# Patient Record
Sex: Male | Born: 1990
Health system: Southern US, Community
[De-identification: ages and names within clinical notes are randomized; demographics above are authoritative.]

## PROBLEM LIST (undated history)

## (undated) DIAGNOSIS — A64 Unspecified sexually transmitted disease: Secondary | ICD-10-CM

## (undated) DIAGNOSIS — A539 Syphilis, unspecified: Secondary | ICD-10-CM

---

## 2011-05-10 ENCOUNTER — Encounter (HOSPITAL_COMMUNITY): Payer: Self-pay | Admitting: Emergency Medicine

## 2011-05-10 ENCOUNTER — Emergency Department (HOSPITAL_COMMUNITY): Payer: No Typology Code available for payment source

## 2011-05-10 ENCOUNTER — Emergency Department (HOSPITAL_COMMUNITY)
Admission: EM | Admit: 2011-05-10 | Discharge: 2011-05-10 | Disposition: A | Discharge status: Home Health | Payer: No Typology Code available for payment source | Attending: Emergency Medicine | Admitting: Emergency Medicine

## 2011-05-10 DIAGNOSIS — Y998 Other external cause status: Secondary | ICD-10-CM | POA: Insufficient documentation

## 2011-05-10 DIAGNOSIS — S0101XA Laceration without foreign body of scalp, initial encounter: Secondary | ICD-10-CM

## 2011-05-10 DIAGNOSIS — Y92410 Unspecified street and highway as the place of occurrence of the external cause: Secondary | ICD-10-CM | POA: Insufficient documentation

## 2011-05-10 DIAGNOSIS — S0100XA Unspecified open wound of scalp, initial encounter: Secondary | ICD-10-CM | POA: Insufficient documentation

## 2011-05-10 DIAGNOSIS — M25529 Pain in unspecified elbow: Secondary | ICD-10-CM | POA: Insufficient documentation

## 2011-05-10 MED ORDER — HYDROCODONE-ACETAMINOPHEN 5-500 MG PO TABS
1.0000 | ORAL_TABLET | Freq: Four times a day (QID) | ORAL | Status: AC | PRN
Start: 2011-05-10 — End: 2011-05-20

## 2011-05-10 MED ORDER — IBUPROFEN 600 MG PO TABS: 600.0000 mg | ORAL_TABLET | Freq: Four times a day (QID) | ORAL | Status: AC | PRN

## 2011-05-10 MED ORDER — FENTANYL CITRATE 0.05 MG/ML IJ SOLN
12.5000 ug | INTRAMUSCULAR | Status: AC | PRN
  Administered 2011-05-10 (×2): 12.5 ug via INTRAVENOUS
  Filled 2011-05-10: qty 2

## 2011-05-10 NOTE — ED Notes (Signed)
Patient transported to CT 

## 2011-05-10 NOTE — ED Notes (Signed)
Pt becoming agitated, pulling at c-collar, trying to get out of bed and raising his voice at staff. Pt c/o HA and nausea. Pt going to radiology at this time.
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
## 2011-05-10 NOTE — ED Notes (Signed)
MD in room placing staples to laceration on right side of scalp. Pt tolerated well.
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
## 2011-05-10 NOTE — ED Notes (Signed)
Pt back from radiology. C/O HA and discomfort from c-collar

## 2011-05-10 NOTE — Discharge Instructions (Signed)
Laceration Care, Adult A laceration is a cut or lesion that goes through all layers of the skin and into the tissue just beneath the skin. TREATMENT  Some lacerations may not require closure. Some lacerations may not be able to be closed due to an increased risk of infection. It is important to see your caregiver as soon as possible after an injury to minimize the risk of infection and maximize the opportunity for successful closure. If closure is appropriate, pain medicines may be given, if needed. The wound will be cleaned to help prevent infection. Your caregiver will use stitches (sutures), staples, wound glue (adhesive), or skin adhesive strips to repair the laceration. These tools bring the skin edges together to allow for faster healing and a better cosmetic outcome. However, all wounds will heal with a scar. Once the wound has healed, scarring can be minimized by covering the wound with sunscreen during the day for 1 full year. HOME CARE INSTRUCTIONS  For sutures or staples:  Keep the wound clean and dry.   If you were given a bandage (dressing), you should change it at least once a day. Also, change the dressing if it becomes wet or dirty, or as directed by your caregiver.   Wash the wound with soap and water 2 times a day. Rinse the wound off with water to remove all soap. Pat the wound dry with a clean towel.   After cleaning, apply a thin layer of the antibiotic ointment as recommended by your caregiver. This will help prevent infection and keep the dressing from sticking.   You may shower as usual after the first 24 hours. Do not soak the wound in water until the sutures are removed.   Only take over-the-counter or prescription medicines for pain, discomfort, or fever as directed by your caregiver.   Get your sutures or staples removed as directed by your caregiver.  For skin adhesive strips:  Keep the wound clean and dry.   Do not get the skin adhesive strips wet. You may bathe  carefully, using caution to keep the wound dry.   If the wound gets wet, pat it dry with a clean towel.   Skin adhesive strips will fall off on their own. You may trim the strips as the wound heals. Do not remove skin adhesive strips that are still stuck to the wound. They will fall off in time.  For wound adhesive:  You may briefly wet your wound in the shower or bath. Do not soak or scrub the wound. Do not swim. Avoid periods of heavy perspiration until the skin adhesive has fallen off on its own. After showering or bathing, gently pat the wound dry with a clean towel.   Do not apply liquid medicine, cream medicine, or ointment medicine to your wound while the skin adhesive is in place. This may loosen the film before your wound is healed.   If a dressing is placed over the wound, be careful not to apply tape directly over the skin adhesive. This may cause the adhesive to be pulled off before the wound is healed.   Avoid prolonged exposure to sunlight or tanning lamps while the skin adhesive is in place. Exposure to ultraviolet light in the first year will darken the scar.   The skin adhesive will usually remain in place for 5 to 10 days, then naturally fall off the skin. Do not pick at the adhesive film.  You may need a tetanus shot if:  You   cannot remember when you had your last tetanus shot.   You have never had a tetanus shot.  If you get a tetanus shot, your arm may swell, get red, and feel warm to the touch. This is common and not a problem. If you need a tetanus shot and you choose not to have one, there is a rare chance of getting tetanus. Sickness from tetanus can be serious. SEEK MEDICAL CARE IF:   You have redness, swelling, or increasing pain in the wound.   You see a red line that goes away from the wound.   You have yellowish-white fluid (pus) coming from the wound.   You have a fever.   You notice a bad smell coming from the wound or dressing.   Your wound breaks  open before or after sutures have been removed.   You notice something coming out of the wound such as wood or glass.   Your wound is on your hand or foot and you cannot move a finger or toe.  SEEK IMMEDIATE MEDICAL CARE IF:   Your pain is not controlled with prescribed medicine.   You have severe swelling around the wound causing pain and numbness or a change in color in your arm, hand, leg, or foot.   Your wound splits open and starts bleeding.   You have worsening numbness, weakness, or loss of function of any joint around or beyond the wound.   You develop painful lumps near the wound or on the skin anywhere on your body.  MAKE SURE YOU:   Understand these instructions.   Will watch your condition.   Will get help right away if you are not doing well or get worse.  Document Released: 03/07/2005 Document Revised: 11/17/2010 Document Reviewed: 08/31/2010 [facility] Patient Information 2012 [place], [place].Motor Vehicle Collision  It is common to have multiple bruises and sore muscles after a motor vehicle collision (MVC). These tend to feel worse for the first 24 hours. You may have the most stiffness and soreness over the first several hours. You may also feel worse when you wake up the first morning after your collision. After this point, you will usually begin to improve with each day. The speed of improvement often depends on the severity of the collision, the number of injuries, and the location and nature of these injuries. HOME CARE INSTRUCTIONS   Put ice on the injured area.   Put ice in a plastic bag.   Place a towel between your skin and the bag.   Leave the ice on for 15 to 20 minutes, 3 to 4 times a day.   Drink enough fluids to keep your urine clear or pale yellow. Do not drink alcohol.   Take a warm shower or bath once or twice a day. This will increase blood flow to sore muscles.   You may return to activities as directed by your caregiver. Be careful when  lifting, as this may aggravate neck or back pain.   Only take over-the-counter or prescription medicines for pain, discomfort, or fever as directed by your caregiver. Do not use aspirin. This may increase bruising and bleeding.  SEEK IMMEDIATE MEDICAL CARE IF:  You have numbness, tingling, or weakness in the arms or legs.   You develop severe headaches not relieved with medicine.   You have severe neck pain, especially tenderness in the middle of the back of your neck.   You have changes in bowel or bladder control.   There  is increasing pain in any area of the body.   You have shortness of breath, lightheadedness, dizziness, or fainting.   You have chest pain.   You feel sick to your stomach (nauseous), throw up (vomit), or sweat.   You have increasing abdominal discomfort.   There is blood in your urine, stool, or vomit.   You have pain in your shoulder (shoulder strap areas).   You feel your symptoms are getting worse.  MAKE SURE YOU:   Understand these instructions.   Will watch your condition.   Will get help right away if you are not doing well or get worse.  Document Released: 03/07/2005 Document Revised: 11/17/2010 Document Reviewed: 08/04/2010 [facility] Patient Information 2012 [place], [place].
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
## 2011-05-17 ENCOUNTER — Emergency Department (HOSPITAL_COMMUNITY)
Admission: EM | Admit: 2011-05-17 | Discharge: 2011-05-17 | Disposition: A | Discharge status: Other Acute Inpatient Hospital | Payer: No Typology Code available for payment source

## 2011-05-17 ENCOUNTER — Encounter (HOSPITAL_COMMUNITY): Payer: Self-pay | Admitting: *Deleted

## 2011-05-17 ENCOUNTER — Emergency Department (HOSPITAL_COMMUNITY)
Admission: EM | Admit: 2011-05-17 | Discharge: 2011-05-17 | Disposition: A | Discharge status: Home / Self Care | Payer: No Typology Code available for payment source | Attending: Emergency Medicine | Admitting: Emergency Medicine

## 2011-05-17 DIAGNOSIS — S0191XA Laceration without foreign body of unspecified part of head, initial encounter: Secondary | ICD-10-CM

## 2011-05-17 DIAGNOSIS — Z4802 Encounter for removal of sutures: Secondary | ICD-10-CM

## 2011-05-17 DIAGNOSIS — R51 Headache: Secondary | ICD-10-CM | POA: Insufficient documentation

## 2011-05-17 NOTE — ED Provider Notes (Signed)
Medical screening examination/treatment/procedure(s) were performed by non-physician practitioner and as supervising physician I was immediately available for consultation/collaboration.   Laray Anger, DO 05/17/11 2131
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
## 2011-05-17 NOTE — ED Notes (Signed)
Patient is here to have staples removed from the posterior scalp.  7 staples noted.  Patient reports they were placed last week

## 2011-05-17 NOTE — Discharge Instructions (Signed)
Staple Care and Removal  Your caregiver has used staples today to repair your wound. Staples are used to help a wound heal faster by holding the edges of the wound together. The staples can be removed when the wound has healed well enough to stay together after the staples are removed. A dressing (wound covering), depending on the location of the wound, may have been applied. This may be changed once per day or as instructed. If the dressing sticks, it may be soaked off with soapy water or hydrogen peroxide.  Only take over-the-counter or prescription medicines for pain, discomfort, or fever as directed by your caregiver.   If you did not receive a tetanus shot today because you did not recall when your last one was given, check with your caregiver when you have your staples removed to determine if one is needed.  Return to your caregiver's office in 1 week or as suggested to have your staples removed.  SEEK IMMEDIATE MEDICAL CARE IF:   · You have redness, swelling, or increasing pain in the wound.   · You have pus coming from the wound.   · You have a fever.   · You notice a bad smell coming from the wound or dressing.   · Your wound edges break open after staples have been removed.   Document Released: 11/30/2000 Document Revised: 11/17/2010 Document Reviewed: 12/15/2004  [employer]® Patient Information ©2012 [employer].

## 2011-10-04 ENCOUNTER — Emergency Department (HOSPITAL_COMMUNITY)
Admission: EM | Admit: 2011-10-04 | Discharge: 2011-10-05 | Disposition: A | Discharge status: Home / Self Care | Payer: Self-pay | Attending: Emergency Medicine | Admitting: Emergency Medicine

## 2011-10-04 ENCOUNTER — Encounter (HOSPITAL_COMMUNITY): Payer: Self-pay | Admitting: Emergency Medicine

## 2011-10-04 DIAGNOSIS — A5149 Other secondary syphilitic conditions: Secondary | ICD-10-CM | POA: Insufficient documentation

## 2011-10-04 MED ORDER — PENICILLIN G BENZATHINE 1200000 UNIT/2ML IM SUSP
2.4000 10*6.[IU] | Freq: Once | INTRAMUSCULAR | Status: AC
  Administered 2011-10-05: 2.4 10*6.[IU] via INTRAMUSCULAR
  Filled 2011-10-04: qty 4

## 2011-10-04 NOTE — ED Provider Notes (Signed)
History     CSN: [id number]  Arrival date & time 10/04/11  2118   First MD Initiated Contact with Patient 10/04/11 2340      Chief Complaint  Patient presents with  . Back Pain    (Consider location/radiation/quality/duration/timing/severity/associated sxs/prior treatment) HPI This is a 21 year old black male who works out at [employer] today. He was not aware of any injury but subsequently developed right flank pain about 7:30 PM. He states that pain has now resolved. He is now concerned about a hyperpigmented rash of his palms and soles that been present for about a week. He states he was treated for primary syphilis about a year and a half ago with 2 shots of antibiotics in the buttocks. He states he also has a history of herpes but has no active lesions. The lesions are not painful or pruritic.   History reviewed. No pertinent past medical history.  History reviewed. No pertinent past surgical history.  No family history on file.  History  Substance Use Topics  . Smoking status: Former Games developer  . Smokeless tobacco: Not on file  . Alcohol Use: Yes     occasional      Review of Systems  All other systems reviewed and are negative.    Allergies  Review of patient's allergies indicates no known allergies.  Home Medications   Current Outpatient Rx  Name Route Sig Dispense Refill  . NAPHAZOLINE HCL 0.012 % OP SOLN Both Eyes Place 1 drop into both eyes 4 (four) times daily. For red eyes.      BP 121/67  Pulse 99  Temp 99.4 F (37.4 C) (Oral)  Resp 18  SpO2 99%  Physical Exam General: Well-developed, well-nourished male in no acute distress; appearance consistent with age of record HENT: normocephalic, atraumatic Eyes: pupils equal round and reactive to light; extraocular muscles intact Neck: supple Heart: regular rate and rhythm Lungs: clear to auscultation bilaterally Abdomen: soft; nondistended GU: No flank tenderness Extremities: No deformity; full range  of motion Neurologic: Awake, alert and oriented; motor function intact in all extremities and symmetric; no facial droop Skin: Warm and dry; hyperpigmented maculopapular rash of the palms and soles Psychiatric: Normal mood and affect    ED Course  Procedures (including critical care time)     MDM  Rash consistent with secondary syphilis. Will treat for syphilis and refer to infectious disease clinic for followup.        Hanley Seamen, MD 10/04/11 [phone]

## 2011-10-04 NOTE — ED Notes (Signed)
Pt reports having lower right back pain 10/10 earlier this evening, states that pain has subsided to 5/10 att this time. Pt also reports bilateral foot "spots" on the bottom of his feet and hands. Pt also has an area approximately size of a dime that is round with a white ring around it on bottom of left foot.

## 2011-10-04 NOTE — ED Notes (Signed)
Pt c/o right side lower back pain that started today around 730p.  Pt worked out at [employer] today but denies injury.  Pt denies abd pain and is afebrile.

## 2011-10-06 ENCOUNTER — Encounter (HOSPITAL_COMMUNITY): Payer: Self-pay | Admitting: *Deleted

## 2011-10-06 DIAGNOSIS — Z87891 Personal history of nicotine dependence: Secondary | ICD-10-CM | POA: Insufficient documentation

## 2011-10-06 DIAGNOSIS — J189 Pneumonia, unspecified organism: Secondary | ICD-10-CM | POA: Insufficient documentation

## 2011-10-06 LAB — URINALYSIS, ROUTINE W REFLEX MICROSCOPIC
Glucose, UA: NEGATIVE mg/dL
Hgb urine dipstick: NEGATIVE
Specific Gravity, Urine: 1.038 — ABNORMAL HIGH (ref 1.005–1.030)

## 2011-10-06 LAB — URINE MICROSCOPIC-ADD ON

## 2011-10-06 NOTE — ED Notes (Signed)
Patient with c/o back pain.  Patient is having flu like symptoms.  Patient was seen at [facility] for back pain and was told that he had strain muscle and sent home.  Patient feels nauseated as well

## 2011-10-07 ENCOUNTER — Emergency Department (HOSPITAL_COMMUNITY)
Admission: EM | Admit: 2011-10-07 | Discharge: 2011-10-07 | Disposition: A | Discharge status: Home / Self Care | Payer: No Typology Code available for payment source | Attending: Emergency Medicine | Admitting: Emergency Medicine

## 2011-10-07 ENCOUNTER — Emergency Department (HOSPITAL_COMMUNITY): Payer: No Typology Code available for payment source

## 2011-10-07 DIAGNOSIS — J189 Pneumonia, unspecified organism: Secondary | ICD-10-CM

## 2011-10-07 LAB — CBC WITH DIFFERENTIAL/PLATELET
Basophils Absolute: 0 10*3/uL (ref 0.0–0.1)
Basophils Relative: 0 % (ref 0–1)
Eosinophils Absolute: 0 10*3/uL (ref 0.0–0.7)
HCT: 40.9 % (ref 39.0–52.0)
Hemoglobin: 14.1 g/dL (ref 13.0–17.0)
MCH: 30.2 pg (ref 26.0–34.0)
MCHC: 34.5 g/dL (ref 30.0–36.0)
Monocytes Absolute: 1.1 10*3/uL — ABNORMAL HIGH (ref 0.1–1.0)
Monocytes Relative: 8 % (ref 3–12)
Neutro Abs: 11.2 10*3/uL — ABNORMAL HIGH (ref 1.7–7.7)
Neutrophils Relative %: 83 % — ABNORMAL HIGH (ref 43–77)
RDW: 13.4 % (ref 11.5–15.5)

## 2011-10-07 MED ORDER — IBUPROFEN 400 MG PO TABS
800.0000 mg | ORAL_TABLET | Freq: Once | ORAL | Status: AC
  Administered 2011-10-07: 800 mg via ORAL
  Filled 2011-10-07: qty 2

## 2011-10-07 MED ORDER — LIDOCAINE HCL (PF) 1 % IJ SOLN
INTRAMUSCULAR | Status: AC
  Filled 2011-10-07: qty 5

## 2011-10-07 MED ORDER — IBUPROFEN 600 MG PO TABS: 600.0000 mg | ORAL_TABLET | Freq: Four times a day (QID) | ORAL | Status: AC | PRN

## 2011-10-07 MED ORDER — CEFTRIAXONE SODIUM 1 G IJ SOLR
1.0000 g | Freq: Once | INTRAMUSCULAR | Status: AC
  Administered 2011-10-07: 1 g via INTRAMUSCULAR
  Filled 2011-10-07: qty 10

## 2011-10-07 MED ORDER — AZITHROMYCIN 250 MG PO TABS
500.0000 mg | ORAL_TABLET | Freq: Once | ORAL | Status: AC
  Administered 2011-10-07: 500 mg via ORAL
  Filled 2011-10-07: qty 2

## 2011-10-07 MED ORDER — AZITHROMYCIN 250 MG PO TABS: 250.0000 mg | ORAL_TABLET | Freq: Every day | ORAL | Status: AC

## 2011-10-07 NOTE — ED Provider Notes (Signed)
History     CSN: [id number]  Arrival date & time 10/06/11  2147   First MD Initiated Contact with Patient 10/07/11 0208      Chief Complaint  Patient presents with  . Back Pain    (Consider location/radiation/quality/duration/timing/severity/associated sxs/prior treatment) HPI Comments: Patient was seen 2 days ago, with a complaint of back pain, and he was treated for secondary syphilis, to your rash on the soles of his feet and palms of his hands with IM penicillin.  Presents today with recurrent back pain and "flulike symptoms, fevers, chills, also, reports discomfort.  While breathing, and a cough, worse at weekly in the morning.  He, says it feels, like something is catching on his right side is taking over-the-counter Advil or ibuprofen, without much relief.  For his symptoms  Patient is a 21 y.o. male presenting with back pain. The history is provided by the patient.  Back Pain  This is a recurrent problem. The current episode started more than 2 days ago. The problem occurs constantly. The problem has been gradually worsening. Associated symptoms include a fever.    History reviewed. No pertinent past medical history.  History reviewed. No pertinent past surgical history.  History reviewed. No pertinent family history.  History  Substance Use Topics  . Smoking status: Former Games developer  . Smokeless tobacco: Not on file  . Alcohol Use: Yes     occasional      Review of Systems  Constitutional: Positive for fever and chills.  Respiratory: Positive for cough and shortness of breath.   Musculoskeletal: Positive for myalgias and back pain.    Allergies  Review of patient's allergies indicates no known allergies.  Home Medications   Current Outpatient Rx  Name Route Sig Dispense Refill  . NAPHAZOLINE HCL 0.012 % OP SOLN Both Eyes Place 1 drop into both eyes daily as needed. For red eyes.    . AZITHROMYCIN 250 MG PO TABS Oral Take 1 tablet (250 mg total) by mouth  daily. 4 tablet 0  . IBUPROFEN 600 MG PO TABS Oral Take 1 tablet (600 mg total) by mouth every 6 (six) hours as needed for pain. 30 tablet 0    BP 121/82  Pulse 74  Temp 97.6 F (36.4 C) (Oral)  Resp 20  SpO2 99%  Physical Exam  Constitutional: He is oriented to person, place, and time. He appears well-developed and well-nourished.  HENT:  Head: Normocephalic.  Eyes: Pupils are equal, round, and reactive to light.  Neck: Normal range of motion.  Cardiovascular: Normal rate.   Pulmonary/Chest: Effort normal and breath sounds normal. No respiratory distress. He has no wheezes. He exhibits no tenderness.  Abdominal: Soft. He exhibits no distension.  Musculoskeletal: Normal range of motion. He exhibits no edema.  Neurological: He is alert and oriented to person, place, and time.  Skin: Skin is warm and dry.    ED Course  Procedures (including critical care time)  Labs Reviewed  URINALYSIS, ROUTINE W REFLEX MICROSCOPIC - Abnormal; Notable for the following:    Color, Urine AMBER (*)  BIOCHEMICALS MAY BE AFFECTED BY COLOR   Specific Gravity, Urine 1.038 (*)     Bilirubin Urine SMALL (*)     Ketones, ur 15 (*)     Protein, ur 30 (*)     Leukocytes, UA SMALL (*)     All other components within normal limits  CBC WITH DIFFERENTIAL - Abnormal; Notable for the following:    WBC 13.5 (*)  Neutrophils Relative 83 (*)     Neutro Abs 11.2 (*)     Lymphocytes Relative 9 (*)     Monocytes Absolute 1.1 (*)     All other components within normal limits  URINE MICROSCOPIC-ADD ON  RAPID HIV SCREEN ([facility])   Dg Chest 2 View  10/07/2011  *RADIOLOGY REPORT*  Clinical Data: Right shoulder and chest pain for 3 days.  Flu-like symptoms.  CHEST - 2 VIEW  Comparison: 05/10/2011  Findings: Interval development of focal airspace infiltration in the right lower lung laterally consistent with pneumonia.  Normal heart size and pulmonary vascularity.  Left lung remains clear and expanded.  No  blunting of costophrenic angles.  No pneumothorax.  IMPRESSION: Focal airspace opacity in the right lower lung consistent with pneumonia.  Original Report Authenticated By: Marlon Pel, M.D.     1. Community acquired pneumonia       MDM   Will obtain chest x-ray to to patient's new complaint of feeling short of breath.  Low-grade fever, and a cough.  This patient is also of concern and great risk for HIV, do, to his lifestyle and history of secondary syphilis, I requested a CBC, which indicates he has a slight elevation in his white count.  Have also asked for an HIV test, which at this time.  Has not been resulted yet.  Review of his chest x-ray reveals a right sided infiltrate, consistent with a pneumonia        Arman Filter, NP 10/07/11 0223  Arman Filter, NP 10/07/11 1610

## 2011-10-07 NOTE — ED Notes (Signed)
No critical labs noted at this time.

## 2013-01-06 ENCOUNTER — Emergency Department (HOSPITAL_COMMUNITY)
Admission: EM | Admit: 2013-01-06 | Discharge: 2013-01-06 | Disposition: A | Discharge status: Home / Self Care | Payer: BC Managed Care – PPO | Attending: Emergency Medicine | Admitting: Emergency Medicine

## 2013-01-06 DIAGNOSIS — S01501A Unspecified open wound of lip, initial encounter: Secondary | ICD-10-CM | POA: Insufficient documentation

## 2013-01-06 DIAGNOSIS — S139XXA Sprain of joints and ligaments of unspecified parts of neck, initial encounter: Secondary | ICD-10-CM | POA: Insufficient documentation

## 2013-01-06 DIAGNOSIS — Z87891 Personal history of nicotine dependence: Secondary | ICD-10-CM | POA: Insufficient documentation

## 2013-01-06 DIAGNOSIS — S161XXA Strain of muscle, fascia and tendon at neck level, initial encounter: Secondary | ICD-10-CM

## 2013-01-06 DIAGNOSIS — S01511A Laceration without foreign body of lip, initial encounter: Secondary | ICD-10-CM

## 2013-01-06 MED ORDER — IBUPROFEN 800 MG PO TABS
800.0000 mg | ORAL_TABLET | Freq: Once | ORAL | Status: AC
  Administered 2013-01-06: 800 mg via ORAL
  Filled 2013-01-06: qty 1
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
## 2013-01-07 NOTE — ED Provider Notes (Signed)
Medical screening examination/treatment/procedure(s) were performed by non-physician practitioner and as supervising physician I was immediately available for consultation/collaboration.   Loren Racer, MD 01/07/13 [phone]

## 2013-01-29 ENCOUNTER — Emergency Department (HOSPITAL_COMMUNITY)
Admission: EM | Admit: 2013-01-29 | Discharge: 2013-01-29 | Disposition: A | Discharge status: Home / Self Care | Payer: BC Managed Care – PPO | Attending: Emergency Medicine | Admitting: Emergency Medicine

## 2013-01-29 ENCOUNTER — Encounter (HOSPITAL_COMMUNITY): Payer: Self-pay | Admitting: Emergency Medicine

## 2013-01-29 DIAGNOSIS — Z113 Encounter for screening for infections with a predominantly sexual mode of transmission: Secondary | ICD-10-CM | POA: Insufficient documentation

## 2013-01-29 DIAGNOSIS — Z4802 Encounter for removal of sutures: Secondary | ICD-10-CM | POA: Insufficient documentation

## 2013-01-29 DIAGNOSIS — F17200 Nicotine dependence, unspecified, uncomplicated: Secondary | ICD-10-CM | POA: Insufficient documentation

## 2013-01-29 DIAGNOSIS — B002 Herpesviral gingivostomatitis and pharyngotonsillitis: Secondary | ICD-10-CM | POA: Insufficient documentation

## 2013-01-29 MED ORDER — VALACYCLOVIR HCL 1 G PO TABS: 2000.0000 mg | ORAL_TABLET | Freq: Two times a day (BID) | ORAL | Status: DC

## 2013-01-29 NOTE — ED Provider Notes (Signed)
Medical screening examination/treatment/procedure(s) were performed by non-physician practitioner and as supervising physician I was immediately available for consultation/collaboration.    Nelia Shi, MD 01/29/13 [phone]

## 2013-01-29 NOTE — ED Notes (Signed)
Patient is alert and oriented x3.  He was given DC instructions and follow up visit instructions.  Patient gave verbal understanding.  He was DC ambulatory under his own power to home.  V/S stable.  He was not showing any signs of distress on DC 

## 2013-01-29 NOTE — ED Provider Notes (Signed)
CSN: [id number]     Arrival date & time 01/29/13  2219 History  This chart was scribed for non-physician practitioner Ivonne Andrew, PA-C, working with Nelia Shi, MD by Dorothey Baseman, ED Scribe. This patient was seen in room WTR9/WTR9 and the patient's care was started at 10:49 PM.    Chief Complaint  Patient presents with  . Suture / Staple Removal   The history is provided by the patient. No language interpreter was used.   HPI Comments: Chad Austin is a 22 y.o. male who presents to the Emergency Department requesting removal of sutures from his mouth that he received 1.5-2 weeks ago. He reports that a few of the stitches have fallen out on their own.   Patient also reports a lesion that he states "looks like a cold sore" to the left-side of the upper lip. He denies any associated pain. He denies any urinary symptoms. Patient denies any other pertinent medical history.   History reviewed. No pertinent past medical history. History reviewed. No pertinent past surgical history. Family History  Problem Relation Age of Onset  . Hypertension Other   . Diabetes Other    History  Substance Use Topics  . Smoking status: Current Every Day Smoker  . Smokeless tobacco: Not on file  . Alcohol Use: Yes     Comment: occasional    Review of Systems  Genitourinary: Negative for difficulty urinating.  Skin: Positive for wound.    Allergies  Review of patient's allergies indicates no known allergies.  Home Medications   Current Outpatient Rx  Name  Route  Sig  Dispense  Refill  . naphazoline (CLEAR EYES) 0.012 % ophthalmic solution   Both Eyes   Place 1 drop into both eyes daily as needed. For red eyes.          Triage Vitals: BP 124/72  Pulse 81  Temp(Src) 97.9 F (36.6 C) (Oral)  Resp 14  SpO2 97%  Physical Exam  Nursing note and vitals reviewed. Constitutional: He is oriented to person, place, and time. He appears well-developed and well-nourished. No distress.   HENT:  Head: Normocephalic and atraumatic.  Eyes: Conjunctivae are normal.  Neck: Normal range of motion. Neck supple.  Cardiovascular: Normal rate, regular rhythm and normal heart sounds.  Exam reveals no gallop and no friction rub.   No murmur heard. Pulmonary/Chest: Effort normal. No respiratory distress.  Abdominal: Soft. He exhibits no distension. There is no tenderness.  Genitourinary: Testes normal and penis normal. Right testis shows no mass and no tenderness. Left testis shows no mass and no tenderness. Circumcised. No penile erythema. No discharge found.  Musculoskeletal: Normal range of motion.  Lymphadenopathy:       Right: No inguinal adenopathy present.       Left: No inguinal adenopathy present.  Neurological: He is alert and oriented to person, place, and time.  Skin: Skin is warm and dry.  2 cm, vesicular lesion to the left-side of the upper lip.   2 blue Prolene sutures the small laceration left lower lip. Wound clean dry and intact, appears well healed no signs of infection.  Psychiatric: He has a normal mood and affect. His behavior is normal.    ED Course  Procedures   DIAGNOSTIC STUDIES: Oxygen Saturation is 97% on room air, normal by my interpretation.    COORDINATION OF CARE: 10:51 PM- Will remove the sutures. Will order STD screening as per the patient's request. Discussed that the lesion to the lip  may be due to a viral infection. Discussed treatment plan with patient at bedside and patient verbalized agreement.    SUTURE REMOVAL Performed by: Ivonne Andrew, PA-C Authorized by: Nelia Shi, MD Consent: Verbal consent obtained. Consent given by: PATIENT Required items: required blood products, implants, devices, and special equipment available  Time out: Immediately prior to procedure a "time out" was called to verify the correct patient, procedure, equipment, support staff and site/side marked as required. Location: Left, lower lip Wound  Appearance: well-healed, no signs of infection Staples Removed: 2 Patient tolerance: Patient tolerated the procedure well with no immediate complications.      MDM   1. Oral herpes simplex infection   2. Visit for suture removal   3. Screen for STD (sexually transmitted disease)      I personally performed the services described in this documentation, which was scribed in my presence. The recorded information has been reviewed and is accurate.     Angus Seller, PA-C 01/29/13 2330

## 2013-01-29 NOTE — ED Notes (Signed)
Pt states he is here to get some stitches removed from his mouth and while he is here he would like to be screened for STDs

## 2013-01-30 LAB — RPR TITER: RPR Titer: 1:16 {titer} — AB

## 2013-01-30 LAB — GC/CHLAMYDIA PROBE AMP: CT Probe RNA: NEGATIVE

## 2013-01-30 LAB — T.PALLIDUM AB, IGG: T pallidum Antibodies (TP-PA): >8 S/CO — ABNORMAL HIGH (ref <0.90)

## 2013-01-31 NOTE — ED Notes (Signed)
Chart sent to EDP office for review.+ RPR 

## 2013-02-01 ENCOUNTER — Emergency Department (HOSPITAL_COMMUNITY)
Admission: EM | Admit: 2013-02-01 | Discharge: 2013-02-01 | Disposition: A | Discharge status: Home / Self Care | Payer: No Typology Code available for payment source | Attending: Emergency Medicine | Admitting: Emergency Medicine

## 2013-02-01 ENCOUNTER — Emergency Department (HOSPITAL_COMMUNITY): Payer: No Typology Code available for payment source

## 2013-02-01 ENCOUNTER — Encounter (HOSPITAL_COMMUNITY): Payer: Self-pay | Admitting: Emergency Medicine

## 2013-02-01 ENCOUNTER — Telehealth (HOSPITAL_COMMUNITY): Payer: Self-pay

## 2013-02-01 DIAGNOSIS — Y9389 Activity, other specified: Secondary | ICD-10-CM | POA: Insufficient documentation

## 2013-02-01 DIAGNOSIS — Z79899 Other long term (current) drug therapy: Secondary | ICD-10-CM | POA: Insufficient documentation

## 2013-02-01 DIAGNOSIS — S139XXA Sprain of joints and ligaments of unspecified parts of neck, initial encounter: Secondary | ICD-10-CM | POA: Insufficient documentation

## 2013-02-01 DIAGNOSIS — Y92410 Unspecified street and highway as the place of occurrence of the external cause: Secondary | ICD-10-CM | POA: Insufficient documentation

## 2013-02-01 DIAGNOSIS — S134XXA Sprain of ligaments of cervical spine, initial encounter: Secondary | ICD-10-CM

## 2013-02-01 DIAGNOSIS — F17200 Nicotine dependence, unspecified, uncomplicated: Secondary | ICD-10-CM | POA: Insufficient documentation

## 2013-02-01 MED ORDER — METHOCARBAMOL 500 MG PO TABS: 500.0000 mg | ORAL_TABLET | Freq: Two times a day (BID) | ORAL | Status: DC
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
## 2013-02-01 NOTE — Progress Notes (Signed)
[facility] provided pt with a list of primary care resources.  °
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
## 2013-02-01 NOTE — ED Provider Notes (Signed)
Medical screening examination/treatment/procedure(s) were performed by non-physician practitioner and as supervising physician I was immediately available for consultation/collaboration.  EKG Interpretation   None         Kelleen Stolze T Yani Lal, MD 02/01/13 1253 
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
## 2013-02-04 ENCOUNTER — Telehealth (HOSPITAL_COMMUNITY): Payer: Self-pay | Admitting: Emergency Medicine

## 2013-02-04 NOTE — ED Notes (Signed)
Chart returned from EDP office. Per Santiago Glad PA-C, follow-up with Urgent Care or Health Department for IM injection of Pencillin.

## 2013-02-05 NOTE — ED Notes (Signed)
Patient is known to [facility] -no need to treat per Pam. His numbers are coming down .

## 2013-02-28 ENCOUNTER — Encounter (HOSPITAL_COMMUNITY): Payer: Self-pay | Admitting: Emergency Medicine

## 2013-02-28 ENCOUNTER — Emergency Department (HOSPITAL_COMMUNITY)
Admission: EM | Admit: 2013-02-28 | Discharge: 2013-02-28 | Disposition: A | Discharge status: Home / Self Care | Payer: BC Managed Care – PPO | Attending: Emergency Medicine | Admitting: Emergency Medicine

## 2013-02-28 DIAGNOSIS — F17200 Nicotine dependence, unspecified, uncomplicated: Secondary | ICD-10-CM | POA: Insufficient documentation

## 2013-02-28 DIAGNOSIS — A539 Syphilis, unspecified: Secondary | ICD-10-CM | POA: Insufficient documentation

## 2013-02-28 MED ORDER — PENICILLIN G BENZATHINE 1200000 UNIT/2ML IM SUSP
2.4000 10*6.[IU] | Freq: Once | INTRAMUSCULAR | Status: AC
  Administered 2013-02-28: 2.4 10*6.[IU] via INTRAMUSCULAR
  Filled 2013-02-28: qty 4

## 2013-02-28 MED ORDER — PENICILLIN G BENZATHINE 1200000 UNIT/2ML IM SUSP
2.4000 10*6.[IU] | Freq: Once | INTRAMUSCULAR | Status: DC
  Filled 2013-02-28: qty 4
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
## 2013-03-04 NOTE — ED Provider Notes (Signed)
Medical screening examination/treatment/procedure(s) were performed by non-physician practitioner and as supervising physician I was immediately available for consultation/collaboration.  EKG Interpretation   None        [name], MD 03/04/13 2114 

## 2013-04-25 ENCOUNTER — Encounter (HOSPITAL_COMMUNITY): Payer: Self-pay | Admitting: Emergency Medicine

## 2013-04-25 ENCOUNTER — Emergency Department (HOSPITAL_COMMUNITY)
Admission: EM | Admit: 2013-04-25 | Discharge: 2013-04-25 | Disposition: A | Discharge status: Home / Self Care | Payer: BC Managed Care – PPO | Attending: Emergency Medicine | Admitting: Emergency Medicine

## 2013-04-25 DIAGNOSIS — R112 Nausea with vomiting, unspecified: Secondary | ICD-10-CM | POA: Insufficient documentation

## 2013-04-25 DIAGNOSIS — R109 Unspecified abdominal pain: Secondary | ICD-10-CM | POA: Insufficient documentation

## 2013-04-25 DIAGNOSIS — R197 Diarrhea, unspecified: Secondary | ICD-10-CM | POA: Insufficient documentation

## 2013-04-25 DIAGNOSIS — F17200 Nicotine dependence, unspecified, uncomplicated: Secondary | ICD-10-CM | POA: Insufficient documentation

## 2013-04-25 LAB — POCT I-STAT, CHEM 8
BUN: 8 mg/dL (ref 6–23)
CALCIUM ION: 1.13 mmol/L (ref 1.12–1.23)
CREATININE: 0.9 mg/dL (ref 0.50–1.35)
Chloride: 101 mEq/L (ref 96–112)
GLUCOSE: 117 mg/dL — AB (ref 70–99)
HCT: 49 % (ref 39.0–52.0)
HEMOGLOBIN: 16.7 g/dL (ref 13.0–17.0)
Potassium: 4 mEq/L (ref 3.7–5.3)
Sodium: 139 mEq/L (ref 137–147)
TCO2: 26 mmol/L (ref 0–100)

## 2013-04-25 MED ORDER — ONDANSETRON HCL 4 MG/2ML IJ SOLN
4.0000 mg | Freq: Once | INTRAMUSCULAR | Status: AC
  Administered 2013-04-25: 4 mg via INTRAVENOUS
  Filled 2013-04-25: qty 2

## 2013-04-25 MED ORDER — ONDANSETRON HCL 4 MG PO TABS: 4.0000 mg | ORAL_TABLET | Freq: Four times a day (QID) | ORAL | Status: DC

## 2013-04-25 MED ORDER — SODIUM CHLORIDE 0.9 % IV BOLUS (SEPSIS)
1000.0000 mL | Freq: Once | INTRAVENOUS | Status: AC
  Administered 2013-04-25: 1000 mL via INTRAVENOUS

## 2013-04-25 NOTE — ED Provider Notes (Signed)
CSN: [id number]     Arrival date & time 04/25/13  1722 History   First MD Initiated Contact with Patient 04/25/13 1813     Chief Complaint  Patient presents with  . Emesis   (Consider location/radiation/quality/duration/timing/severity/associated sxs/prior Treatment) The history is provided by the patient.   Patient reports crampy abdominal pain and N/V x 20+, watery stool x 1, that occurred 25 minutes after eating [place] bacon and eggs this morning.  Emesis is described as "bile."  No blood in emesis or stool.  States abdominal pain has completely resolved and currently he is without nausea but thinks he may be dehydrated.  Has been attempting to drink gatorade but is not holding anything down.  No hx abdominal surgery. No one else who ate this food is complaining of symptoms.  No known sick contacts.    History reviewed. No pertinent past medical history. History reviewed. No pertinent past surgical history. Family History  Problem Relation Age of Onset  . Hypertension Other   . Diabetes Other    History  Substance Use Topics  . Smoking status: Current Every Day Smoker  . Smokeless tobacco: Not on file  . Alcohol Use: Yes     Comment: occasional    Review of Systems  Constitutional: Negative for fever and chills.  Respiratory: Negative for cough and shortness of breath.   Cardiovascular: Negative for chest pain.  Gastrointestinal: Positive for nausea, vomiting and diarrhea. Negative for abdominal pain and blood in stool.  Genitourinary: Negative for dysuria, urgency and frequency.  All other systems reviewed and are negative.    Allergies  Review of patient's allergies indicates no known allergies.  Home Medications  No current outpatient prescriptions on file. BP 126/75  Pulse 111  Temp(Src) 99.1 F (37.3 C) (Oral)  Resp 18  SpO2 99% Physical Exam  Nursing note and vitals reviewed. Constitutional: He appears well-developed and well-nourished. No distress.  HENT:   Head: Normocephalic and atraumatic.  Neck: Neck supple.  Cardiovascular: Normal rate and regular rhythm.   Pulmonary/Chest: Effort normal and breath sounds normal. No respiratory distress. He has no wheezes. He has no rales.  Abdominal: Soft. He exhibits no distension and no mass. There is no tenderness. There is no rebound and no guarding.  Neurological: He is alert. He exhibits normal muscle tone.  Skin: He is not diaphoretic.    ED Course  Procedures (including critical care time) Labs Review Labs Reviewed  POCT I-STAT, CHEM 8 - Abnormal; Notable for the following:    Glucose, Bld 117 (*)    All other components within normal limits   Imaging Review No results found.  EKG Interpretation   None      Filed Vitals:   04/25/13 1816  BP: 126/75  Pulse: 111  Temp: 99.1 F (37.3 C)  Resp: 18    MDM   1. Nausea vomiting and diarrhea     Pt with N/V following eating.  No abdominal pain.  Abdominal exam is benign.  Tachycardic on arrival.  IVF given.  Chem 8 normal.  Mild clinical dehydration on exam.  Pt feeling much improved after IVF.  PO trial prior to discharge.  D/C home with zofran. Discussed result, findings, treatment, and follow up  with patient.  Pt given return precautions.  Pt verbalizes understanding and agrees with plan.        Castle PointEmily Adaleen Hulgan, PA-C 04/25/13 2301

## 2013-04-25 NOTE — Discharge Instructions (Signed)
Read the information below.  Use the prescribed medication as directed.  Please discuss all new medications with your pharmacist.  You may return to the Emergency Department at any time for worsening condition or any new symptoms that concern you.  If you develop fevers, worsening abdominal pain, uncontrolled vomiting, or are unable to tolerate fluids by mouth, return to the ER for a recheck.

## 2013-04-25 NOTE — ED Notes (Signed)
Initial Contact - pt sleeping soundly on stretcher, arousable to verbal stim.  [facility]  IVF inf a/o.  Pt denies further needs/complaints at this time.  NAD.

## 2013-04-25 NOTE — ED Notes (Signed)
Pt was nauseated and vomiting since this am. No diarrhea.  Some cramping.  No fever.  Pt ate sausage/turkey bacon and orange juice at work and started getting sick within 25 min.  Pt last vomited 45 min ago.

## 2013-04-26 NOTE — ED Provider Notes (Signed)
Medical screening examination/treatment/procedure(s) were performed by non-physician practitioner and as supervising physician I was immediately available for consultation/collaboration.  EKG Interpretation   None         Laray AngerKathleen M Mikie Misner, DO 04/26/13 1427
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
## 2015-07-13 ENCOUNTER — Emergency Department (HOSPITAL_COMMUNITY)
Admission: EM | Admit: 2015-07-13 | Discharge: 2015-07-13 | Disposition: A | Discharge status: Home / Self Care | Payer: No Typology Code available for payment source | Attending: Emergency Medicine | Admitting: Emergency Medicine

## 2015-07-13 ENCOUNTER — Encounter (HOSPITAL_COMMUNITY): Payer: Self-pay | Admitting: Emergency Medicine

## 2015-07-13 DIAGNOSIS — F17200 Nicotine dependence, unspecified, uncomplicated: Secondary | ICD-10-CM | POA: Insufficient documentation

## 2015-07-13 DIAGNOSIS — Z79899 Other long term (current) drug therapy: Secondary | ICD-10-CM | POA: Insufficient documentation

## 2015-07-13 DIAGNOSIS — N342 Other urethritis: Secondary | ICD-10-CM

## 2015-07-13 DIAGNOSIS — N341 Nonspecific urethritis: Secondary | ICD-10-CM | POA: Insufficient documentation

## 2015-07-13 LAB — URINALYSIS, ROUTINE W REFLEX MICROSCOPIC
BILIRUBIN URINE: NEGATIVE
GLUCOSE, UA: NEGATIVE mg/dL
HGB URINE DIPSTICK: NEGATIVE
KETONES UR: NEGATIVE mg/dL
LEUKOCYTES UA: NEGATIVE
Nitrite: NEGATIVE
PROTEIN: NEGATIVE mg/dL
Specific Gravity, Urine: 1.009 (ref 1.005–1.030)
pH: 6 (ref 5.0–8.0)

## 2015-07-13 MED ORDER — AZITHROMYCIN 250 MG PO TABS
1000.0000 mg | ORAL_TABLET | Freq: Once | ORAL | Status: AC
  Administered 2015-07-13: 1000 mg via ORAL
  Filled 2015-07-13: qty 4

## 2015-07-13 MED ORDER — CEFTRIAXONE SODIUM 250 MG IJ SOLR
250.0000 mg | Freq: Once | INTRAMUSCULAR | Status: AC
  Administered 2015-07-13: 250 mg via INTRAMUSCULAR
  Filled 2015-07-13: qty 250

## 2015-07-13 NOTE — ED Notes (Signed)
Pt states he began having symptoms of Chlamydia on Wednesday last week. Tried to get in at the health department and wasn't able to.

## 2015-07-13 NOTE — ED Provider Notes (Signed)
CSN: [id number]     Arrival date & time 07/13/15  [id number] History   First MD Initiated Contact with Patient 07/13/15 [phone]     Chief Complaint  Patient presents with  . SEXUALLY TRANSMITTED DISEASE     (Consider location/radiation/quality/duration/timing/severity/associated sxs/prior Treatment) HPI  25 year old male presents with a concern for an STD. He thinks he has gonorrhea. Patient states he has multiple sexual partners. He has had gonorrhea in the past. Over the last 4 days he's been having discharge from his penis. Some burning at the tip of his penis during urination. No abdominal pain. No penile lesions or rashes. Denies testicular pain. Patient was trying to get into the health department but was unable to. He has a history of a positive RPR in the past but states that it has been stable. He denies any rashes to his extremities. No headaches.  History reviewed. No pertinent past medical history. History reviewed. No pertinent past surgical history. Family History  Problem Relation Age of Onset  . Hypertension Other   . Diabetes Other    Social History  Substance Use Topics  . Smoking status: Current Every Day Smoker  . Smokeless tobacco: None  . Alcohol Use: Yes     Comment: occasional    Review of Systems  Constitutional: Negative for fever.  Genitourinary: Positive for dysuria, discharge and penile pain. Negative for penile swelling, scrotal swelling and testicular pain.  Skin: Negative for rash.  All other systems reviewed and are negative.     Allergies  Review of patient's allergies indicates no known allergies.  Home Medications   Prior to Admission medications   Medication Sig Start Date End Date Taking? Authorizing Provider  ondansetron (ZOFRAN) 4 MG tablet Take 1 tablet (4 mg total) by mouth every 6 (six) hours. 04/25/13   Trixie DredgeEmily West, PA-C   BP 157/114 mmHg  Pulse 83  Temp(Src) 98.6 F (37 C) (Oral)  Resp 18  SpO2 100% Physical Exam  Constitutional:  He is oriented to person, place, and time. He appears well-developed and well-nourished.  HENT:  Head: Normocephalic and atraumatic.  Right Ear: External ear normal.  Left Ear: External ear normal.  Nose: Nose normal.  Eyes: Right eye exhibits no discharge. Left eye exhibits no discharge.  Pulmonary/Chest: Effort normal.  Abdominal: Soft. He exhibits no distension. There is no tenderness.  Genitourinary: Penis normal. Right testis shows no swelling and no tenderness. Left testis shows no swelling and no tenderness. Circumcised. No penile erythema or penile tenderness. No discharge found.  No discharge seen. No tenderness or swelling  Musculoskeletal: He exhibits no edema.  Neurological: He is alert and oriented to person, place, and time.  Skin: Skin is warm and dry.  Nursing note and vitals reviewed.   ED Course  Procedures (including critical care time) Labs Review Labs Reviewed  URINALYSIS, ROUTINE W REFLEX MICROSCOPIC (NOT AT [facility])  HIV ANTIBODY (ROUTINE TESTING)  GC/CHLAMYDIA PROBE AMP ([place]) NOT AT [facility]    Imaging Review No results found. I have personally reviewed and evaluated these images and lab results as part of my medical decision-making.   EKG Interpretation None      MDM   Final diagnoses:  Urethritis    Patient symptoms are most consistent with urethritis. He was given Rocephin and azithromycin. He has no clinical signs of syphilis or other acute illness. Urinalysis is unremarkable, UTI seems unlikely. He requests an HIV test which will be drawn. Discharge home with return precautions.  Pricilla Loveless, MD 07/13/15 [phone]

## 2015-07-14 LAB — GC/CHLAMYDIA PROBE AMP (~~LOC~~) NOT AT ARMC
Chlamydia: NEGATIVE
NEISSERIA GONORRHEA: NEGATIVE

## 2015-07-14 LAB — HIV ANTIBODY (ROUTINE TESTING W REFLEX): HIV Screen 4th Generation wRfx: NONREACTIVE

## 2015-11-03 ENCOUNTER — Emergency Department (HOSPITAL_BASED_OUTPATIENT_CLINIC_OR_DEPARTMENT_OTHER)
Admission: EM | Admit: 2015-11-03 | Discharge: 2015-11-03 | Disposition: A | Discharge status: Home / Self Care | Payer: BLUE CROSS/BLUE SHIELD | Attending: Emergency Medicine | Admitting: Emergency Medicine

## 2015-11-03 ENCOUNTER — Encounter (HOSPITAL_BASED_OUTPATIENT_CLINIC_OR_DEPARTMENT_OTHER): Payer: Self-pay | Admitting: *Deleted

## 2015-11-03 DIAGNOSIS — I10 Essential (primary) hypertension: Secondary | ICD-10-CM | POA: Insufficient documentation

## 2015-11-03 DIAGNOSIS — N342 Other urethritis: Secondary | ICD-10-CM

## 2015-11-03 DIAGNOSIS — N341 Nonspecific urethritis: Secondary | ICD-10-CM | POA: Insufficient documentation

## 2015-11-03 DIAGNOSIS — R369 Urethral discharge, unspecified: Secondary | ICD-10-CM | POA: Diagnosis present

## 2015-11-03 DIAGNOSIS — F17200 Nicotine dependence, unspecified, uncomplicated: Secondary | ICD-10-CM | POA: Diagnosis not present

## 2015-11-03 LAB — URINALYSIS, ROUTINE W REFLEX MICROSCOPIC
Bilirubin Urine: NEGATIVE
Glucose, UA: NEGATIVE mg/dL
Hgb urine dipstick: NEGATIVE
Ketones, ur: NEGATIVE mg/dL
Nitrite: NEGATIVE
PH: 6 (ref 5.0–8.0)
Protein, ur: NEGATIVE mg/dL
Specific Gravity, Urine: 1.013 (ref 1.005–1.030)

## 2015-11-03 LAB — URINE MICROSCOPIC-ADD ON: RBC / HPF: NONE SEEN RBC/hpf (ref 0–5)

## 2015-11-03 MED ORDER — ONDANSETRON 8 MG PO TBDP
8.0000 mg | ORAL_TABLET | Freq: Once | ORAL | Status: AC
  Administered 2015-11-03: 8 mg via ORAL
  Filled 2015-11-03: qty 1

## 2015-11-03 MED ORDER — LIDOCAINE HCL (PF) 1 % IJ SOLN
INTRAMUSCULAR | Status: AC
Start: 2015-11-03 — End: 2015-11-03
  Administered 2015-11-03: 1 mL
  Filled 2015-11-03: qty 5

## 2015-11-03 MED ORDER — CEFTRIAXONE SODIUM 250 MG IJ SOLR
250.0000 mg | Freq: Once | INTRAMUSCULAR | Status: AC
  Administered 2015-11-03: 250 mg via INTRAMUSCULAR
  Filled 2015-11-03: qty 250

## 2015-11-03 MED ORDER — AZITHROMYCIN 250 MG PO TABS
1000.0000 mg | ORAL_TABLET | Freq: Once | ORAL | Status: AC
  Administered 2015-11-03: 1000 mg via ORAL
  Filled 2015-11-03: qty 4

## 2015-11-03 NOTE — ED Provider Notes (Signed)
[facility] Provider Note   CSN: [id number] Arrival date & time: 11/03/15  [id number]     History   Chief Complaint Chief Complaint  Patient presents with  . Penile Discharge    HPI Chad Austin is a 25 y.o. male male who presents with chief complaint of penile discharge and dysuria. He is a MSM with a previous hx of syphilis infection who [facility] for tighter draws. The patient noticed some discharge from his penis 4 days ago. He states he is in a monogamous relationship at this time. He denies any new lesions, testicular pain.  HPI  No past medical history on file.  There are no active problems to display for this patient.   No past surgical history on file.     Home Medications    Prior to Admission medications   Medication Sig Start Date End Date Taking? Authorizing Provider  cholecalciferol (VITAMIN D) 1000 units tablet Take 2,000 Units by mouth daily.    Historical Provider, MD  MELATONIN PO Take 50 mg by mouth at bedtime as needed (for sleep).    Historical Provider, MD    Family History Family History  Problem Relation Age of Onset  . Hypertension Other   . Diabetes Other     Social History Social History  Substance Use Topics  . Smoking status: Current Every Day Smoker  . Smokeless tobacco: Not on file  . Alcohol use Yes     Comment: occasional     Allergies   Review of patient's allergies indicates no known allergies.   Review of Systems Review of Systems  Constitutional: Negative for fever.  Gastrointestinal: Negative for abdominal pain and vomiting.  Genitourinary: Positive for discharge and dysuria. Negative for genital sores and testicular pain.     Physical Exam Updated Vital Signs BP (!) 155/109 (BP Location: Left Arm)   Pulse 77   Temp 98.1 F (36.7 C) (Oral)   Resp 18   Ht 5\' 7"  (1.702 m)   Wt 113.4 kg   SpO2 97%   BMI 39.16 kg/m   Physical Exam  Constitutional: He appears  well-developed and well-nourished. No distress.  HENT:  Head: Normocephalic and atraumatic.  Eyes: Conjunctivae are normal. No scleral icterus.  Neck: Normal range of motion. Neck supple.  Cardiovascular: Normal rate, regular rhythm and normal heart sounds.   Pulmonary/Chest: Effort normal and breath sounds normal. No respiratory distress.  Abdominal: Soft. There is no tenderness.  Genitourinary: Penis normal. No penile tenderness.  Genitourinary Comments: Normal male anatomy, circumcised, no testicular tenderness or inguinal lymphadenopathy, no visible discharge or lesions.  Musculoskeletal: He exhibits no edema.  Neurological: He is alert.  Skin: Skin is warm and dry. He is not diaphoretic.  Psychiatric: His behavior is normal.  Nursing note and vitals reviewed.    ED Treatments / Results  Labs (all labs ordered are listed, but only abnormal results are displayed) Labs Reviewed  RPR  HIV ANTIBODY (ROUTINE TESTING)  URINALYSIS, ROUTINE W REFLEX MICROSCOPIC (NOT AT [facility])  GC/CHLAMYDIA PROBE AMP ([place]) NOT AT [facility]    EKG  EKG Interpretation None       Radiology No results found.  Procedures Procedures (including critical care time)  Medications Ordered in ED Medications  cefTRIAXone (ROCEPHIN) injection 250 mg (not administered)  azithromycin (ZITHROMAX) tablet 1,000 mg (not administered)  ondansetron (ZOFRAN-ODT) disintegrating tablet 8 mg (not administered)     Initial Impression / Assessment and Plan /  ED Course  I have reviewed the triage vital signs and the nursing notes.  Pertinent labs & imaging results that were available during my care of the patient were reviewed by me and considered in my medical decision making (see chart for details).  Clinical Course    Patient with urethral radius, he is 25 year old homosexual male and is in a high-risk demographic for HIV transmission. He is also a previous history of syphilis. I discussed prep therapy  with the patient who is very interested and will have referred him to Dr. Judyann Munsonynthia Snider over at the infectious disease clinic.  Patient treated here with Rocephin and azithromycin.  I have drawn, HIV and RPR here. Distress. Safe sexual practices with the patient. He appears safe for discharge at this time  Final Clinical Impressions(s) / ED Diagnoses   Final diagnoses:  Urethritis  Essential hypertension    New Prescriptions New Prescriptions   No medications on file     Arthor Captainbigail Ryman Rathgeber, PA-C 11/03/15 1124    Melene Planan Floyd, DO 11/03/15 1531

## 2015-11-03 NOTE — ED Triage Notes (Signed)
Patient states he has had milky penile discharge for the three days.  States he has had unprotected sex with multiple partners.

## 2015-11-04 LAB — GC/CHLAMYDIA PROBE AMP (~~LOC~~) NOT AT ARMC
Chlamydia: NEGATIVE
NEISSERIA GONORRHEA: NEGATIVE

## 2015-11-04 LAB — HIV ANTIBODY (ROUTINE TESTING W REFLEX): HIV Screen 4th Generation wRfx: NONREACTIVE

## 2015-11-04 LAB — RPR: RPR Ser Ql: REACTIVE — AB

## 2015-11-04 LAB — RPR, QUANT+TP ABS (REFLEX)
Rapid Plasma Reagin, Quant: 1:8 {titer} — ABNORMAL HIGH
TREPONEMA PALLIDUM AB: POSITIVE — AB

## 2015-11-06 ENCOUNTER — Telehealth (HOSPITAL_BASED_OUTPATIENT_CLINIC_OR_DEPARTMENT_OTHER): Payer: Self-pay

## 2016-01-12 ENCOUNTER — Telehealth (HOSPITAL_BASED_OUTPATIENT_CLINIC_OR_DEPARTMENT_OTHER): Payer: Self-pay | Admitting: Emergency Medicine

## 2016-01-12 NOTE — Telephone Encounter (Signed)
Lost to followup 

## 2016-01-25 ENCOUNTER — Encounter (HOSPITAL_BASED_OUTPATIENT_CLINIC_OR_DEPARTMENT_OTHER): Payer: Self-pay | Admitting: *Deleted

## 2016-01-25 ENCOUNTER — Emergency Department (HOSPITAL_BASED_OUTPATIENT_CLINIC_OR_DEPARTMENT_OTHER)
Admission: EM | Admit: 2016-01-25 | Discharge: 2016-01-26 | Disposition: A | Discharge status: Home / Self Care | Payer: BLUE CROSS/BLUE SHIELD | Attending: Emergency Medicine | Admitting: Emergency Medicine

## 2016-01-25 DIAGNOSIS — R369 Urethral discharge, unspecified: Secondary | ICD-10-CM

## 2016-01-25 DIAGNOSIS — F17210 Nicotine dependence, cigarettes, uncomplicated: Secondary | ICD-10-CM | POA: Insufficient documentation

## 2016-01-25 DIAGNOSIS — A539 Syphilis, unspecified: Secondary | ICD-10-CM | POA: Insufficient documentation

## 2016-01-25 DIAGNOSIS — N342 Other urethritis: Secondary | ICD-10-CM | POA: Insufficient documentation

## 2016-01-25 HISTORY — DX: Syphilis, unspecified: A53.9

## 2016-01-25 HISTORY — DX: Unspecified sexually transmitted disease: A64

## 2016-01-25 LAB — URINALYSIS, ROUTINE W REFLEX MICROSCOPIC
Bilirubin Urine: NEGATIVE
Glucose, UA: NEGATIVE mg/dL
HGB URINE DIPSTICK: NEGATIVE
KETONES UR: NEGATIVE mg/dL
NITRITE: NEGATIVE
PH: 6.5 (ref 5.0–8.0)
Protein, ur: NEGATIVE mg/dL
Specific Gravity, Urine: 1.021 (ref 1.005–1.030)

## 2016-01-25 LAB — URINE MICROSCOPIC-ADD ON

## 2016-01-25 MED ORDER — PENICILLIN G BENZATHINE 1200000 UNIT/2ML IM SUSP
2.4000 10*6.[IU] | Freq: Once | INTRAMUSCULAR | Status: AC
  Administered 2016-01-25: 2.4 10*6.[IU] via INTRAMUSCULAR
  Filled 2016-01-25: qty 4

## 2016-01-25 MED ORDER — LIDOCAINE HCL (PF) 1 % IJ SOLN
INTRAMUSCULAR | Status: AC
  Administered 2016-01-25: 0.9 mL
  Filled 2016-01-25: qty 5

## 2016-01-25 MED ORDER — SULFAMETHOXAZOLE-TRIMETHOPRIM 800-160 MG PO TABS: 1.0000 | ORAL_TABLET | Freq: Two times a day (BID) | ORAL | 0 refills | Status: AC

## 2016-01-25 MED ORDER — AZITHROMYCIN 250 MG PO TABS
1000.0000 mg | ORAL_TABLET | Freq: Once | ORAL | Status: AC
  Administered 2016-01-25: 1000 mg via ORAL
  Filled 2016-01-25: qty 4

## 2016-01-25 MED ORDER — CEFTRIAXONE SODIUM 250 MG IJ SOLR
250.0000 mg | Freq: Once | INTRAMUSCULAR | Status: AC
  Administered 2016-01-25: 250 mg via INTRAMUSCULAR
  Filled 2016-01-25: qty 250

## 2016-01-25 NOTE — ED Triage Notes (Signed)
Pt. Reports unprotected sex with 2 partners.

## 2016-01-25 NOTE — Discharge Instructions (Signed)
Followup with the ID clinic.  Follow safe sex practices. Return to the ED if you develop new or worsening symptoms.

## 2016-01-25 NOTE — ED Provider Notes (Signed)
[facility] Note   CSN: [id number] Arrival date & time: 01/25/16  2307  By signing my name below, I, Teofilo PodMatthew P. Jamison, attest that this documentation has been prepared under the direction and in the presence of Glynn OctaveStephen Washington Whedbee, MD . Electronically Signed: Teofilo PodMatthew P. Jamison, ED Scribe. 01/25/2016. 11:21 PM.    History   Chief Complaint Chief Complaint  Patient presents with  . Penile Discharge     The history is provided by the patient. No language interpreter was used.   HPI Comments:  Chad Austin is a 25 y.o. male with PMHx of syphillis who presents to the Emergency Department complaining of sudden onset penile discharge x 1 day. Pt states that the discharge is white in color. Pt reports associated dysuria. Pt is sexually active with 2 male partners currently, and notes that he uses does not use a condom every time. No known allergies to medications. No alleviating factors noted. Pt denies abdominal pain, blood in urine, penile pain, testicular pain, rash, back pain or fever.    No past medical history on file.  There are no active problems to display for this patient.   No past surgical history on file.     Home Medications    Prior to Admission medications   Medication Sig Start Date End Date Taking? Authorizing Provider  cholecalciferol (VITAMIN D) 1000 units tablet Take 2,000 Units by mouth daily.    Historical Provider, MD  MELATONIN PO Take 50 mg by mouth at bedtime as needed (for sleep).    Historical Provider, MD    Family History Family History  Problem Relation Age of Onset  . Hypertension Other   . Diabetes Other     Social History Social History  Substance Use Topics  . Smoking status: Current Every Day Smoker    Packs/day: 0.50    Types: Cigarettes  . Smokeless tobacco: Current User  . Alcohol use Yes     Comment: occasional     Allergies   Patient has no known allergies.   Review of Systems Review of Systems    Genitourinary: Positive for discharge and dysuria. Negative for hematuria, penile pain and testicular pain.  Skin: Negative for rash.   A complete 10 system review of systems was obtained and all systems are negative except as noted in the HPI and PMH.     Physical Exam Updated Vital Signs BP 144/89 (BP Location: Right Arm)   Pulse 94   Temp 98.4 F (36.9 C) (Oral)   Resp 18   Ht 6' (1.829 m)   Wt 230 lb (104.3 kg)   SpO2 100%   BMI 31.19 kg/m   Physical Exam  Constitutional: He is oriented to person, place, and time. He appears well-developed and well-nourished. No distress.  HENT:  Head: Normocephalic and atraumatic.  Mouth/Throat: Oropharynx is clear and moist. No oropharyngeal exudate.  Eyes: Conjunctivae and EOM are normal. Pupils are equal, round, and reactive to light.  Neck: Normal range of motion. Neck supple.  No meningismus.  Cardiovascular: Normal rate, regular rhythm, normal heart sounds and intact distal pulses.   No murmur heard. Pulmonary/Chest: Effort normal and breath sounds normal. No respiratory distress.  Abdominal: Soft. There is no tenderness. There is no rebound and no guarding.  Genitourinary: Penis normal. No penile tenderness.  Genitourinary Comments: No rashes or testicular tenderness.   Musculoskeletal: Normal range of motion. He exhibits no edema or tenderness.  Neurological: He is alert and oriented to  person, place, and time. No cranial nerve deficit. He exhibits normal muscle tone. Coordination normal.  No ataxia on finger to nose bilaterally. No pronator drift. 5/5 strength throughout. CN 2-12 intact.Equal grip strength. Sensation intact.   Skin: Skin is warm.  Psychiatric: He has a normal mood and affect. His behavior is normal.  Nursing note and vitals reviewed.    ED Treatments / Results  DIAGNOSTIC STUDIES:  Oxygen Saturation is 100% on RA, normal by my interpretation.    COORDINATION OF CARE:  11:21 PM Discussed treatment plan  with pt at bedside and pt agreed to plan.   Labs (all labs ordered are listed, but only abnormal results are displayed) Labs Reviewed  URINALYSIS, ROUTINE W REFLEX MICROSCOPIC (NOT AT [facility]) - Abnormal; Notable for the following:       Result Value   APPearance CLOUDY (*)    Leukocytes, UA MODERATE (*)    All other components within normal limits  URINE MICROSCOPIC-ADD ON - Abnormal; Notable for the following:    Squamous Epithelial / LPF 0-5 (*)    Bacteria, UA MANY (*)    All other components within normal limits  URINE CULTURE  RPR  HIV ANTIBODY (ROUTINE TESTING)  RAPID HIV SCREEN (HIV 1/2 AB+AG)  GC/CHLAMYDIA PROBE AMP ([place]) NOT AT [facility]    EKG  EKG Interpretation None       Radiology No results found.  Procedures Procedures (including critical care time)  Medications Ordered in ED Medications - No data to display   Initial Impression / Assessment and Plan / ED Course  I have reviewed the triage vital signs and the nursing notes.  Pertinent labs & imaging results that were available during my care of the patient were reviewed by me and considered in my medical decision making (see chart for details).  Clinical Course    Patient with history of urethritis as well as syphilis presenting with white penile discharge for the past several days. Admits to unprotected sex with multiple partners.  States he was last treated for syphilis last year. Titers from August were positive. Denies any rashes or genital lesions. None seen on exam.  Treatment empirically for gonorrhea, chlamydia, and syphilis. Needs infectious disease follow-up. Safe sex practices discussed. RPR and HIV drawn. Urine culture sent.  Final Clinical Impressions(s) / ED Diagnoses   Final diagnoses:  Penile discharge  Syphilis  Urethritis    New Prescriptions New Prescriptions   No medications on file  I personally performed the services described in this documentation, which was scribed  in my presence. The recorded information has been reviewed and is accurate.     Glynn OctaveStephen Syndey Jaskolski, MD 01/26/16 0002

## 2016-01-27 LAB — URINE CULTURE: Culture: NO GROWTH

## 2016-01-27 LAB — HIV ANTIBODY (ROUTINE TESTING W REFLEX): HIV Screen 4th Generation wRfx: NONREACTIVE

## 2016-01-28 LAB — RPR: RPR: REACTIVE — AB

## 2016-01-28 LAB — RPR, QUANT+TP ABS (REFLEX): TREPONEMA PALLIDUM AB: POSITIVE — AB

## 2016-04-24 ENCOUNTER — Encounter (HOSPITAL_BASED_OUTPATIENT_CLINIC_OR_DEPARTMENT_OTHER): Payer: Self-pay | Admitting: Emergency Medicine

## 2016-04-24 ENCOUNTER — Emergency Department (HOSPITAL_BASED_OUTPATIENT_CLINIC_OR_DEPARTMENT_OTHER)
Admission: EM | Admit: 2016-04-24 | Discharge: 2016-04-24 | Disposition: A | Discharge status: Home / Self Care | Payer: BLUE CROSS/BLUE SHIELD | Attending: Emergency Medicine | Admitting: Emergency Medicine

## 2016-04-24 DIAGNOSIS — Z202 Contact with and (suspected) exposure to infections with a predominantly sexual mode of transmission: Secondary | ICD-10-CM | POA: Insufficient documentation

## 2016-04-24 DIAGNOSIS — F17210 Nicotine dependence, cigarettes, uncomplicated: Secondary | ICD-10-CM | POA: Insufficient documentation

## 2016-04-24 LAB — URINALYSIS, ROUTINE W REFLEX MICROSCOPIC
Bilirubin Urine: NEGATIVE
GLUCOSE, UA: NEGATIVE mg/dL
HGB URINE DIPSTICK: NEGATIVE
KETONES UR: NEGATIVE mg/dL
Leukocytes, UA: NEGATIVE
Nitrite: NEGATIVE
PH: 6.5 (ref 5.0–8.0)
PROTEIN: NEGATIVE mg/dL
Specific Gravity, Urine: 1.017 (ref 1.005–1.030)

## 2016-04-24 MED ORDER — PENICILLIN G BENZATHINE 1200000 UNIT/2ML IM SUSP
INTRAMUSCULAR | Status: AC
  Filled 2016-04-24: qty 2

## 2016-04-24 MED ORDER — PENICILLIN G BENZATHINE 1200000 UNIT/2ML IM SUSP
2.4000 10*6.[IU] | Freq: Once | INTRAMUSCULAR | Status: AC
  Administered 2016-04-24: 2.4 10*6.[IU] via INTRAMUSCULAR
  Filled 2016-04-24: qty 4

## 2016-04-24 NOTE — ED Provider Notes (Signed)
[facility] Note   CSN: [id number] Arrival date & time: 04/24/16  1122     History   Chief Complaint Chief Complaint  Patient presents with  . Dysuria    HPI Chad Austin is a 26 y.o. male.  Patient is a 26 year old male who presents for evaluation of STD exposure. He rates that a representative from the health department called him on Thursday informing him that he had been exposed to syphilis and they recommended he seek medical attention. The patient does report he has had some urethral discharge, but denies any sores or lesions. He reports some dysuria. He reports having had syphilis in the past and has also had other STDs as well.   The history is provided by the patient.  Dysuria   This is a new problem. The current episode started 2 days ago. The problem has not changed since onset.The quality of the pain is described as burning. There has been no fever. He is sexually active. Pertinent negatives include no chills. He has tried nothing for the symptoms.    Past Medical History:  Diagnosis Date  . STD (sexually transmitted disease)   . Syphilis     There are no active problems to display for this patient.   History reviewed. No pertinent surgical history.     Home Medications    Prior to Admission medications   Medication Sig Start Date End Date Taking? Authorizing Provider  cholecalciferol (VITAMIN D) 1000 units tablet Take 2,000 Units by mouth daily.    Historical Provider, MD  MELATONIN PO Take 50 mg by mouth at bedtime as needed (for sleep).    Historical Provider, MD    Family History Family History  Problem Relation Age of Onset  . Hypertension Other   . Diabetes Other     Social History Social History  Substance Use Topics  . Smoking status: Current Every Day Smoker    Packs/day: 0.50    Types: Cigarettes  . Smokeless tobacco: Current User  . Alcohol use Yes     Comment: occasional     Allergies   Patient has no  known allergies.   Review of Systems Review of Systems  Constitutional: Negative for chills.  Genitourinary: Positive for dysuria.  All other systems reviewed and are negative.    Physical Exam Updated Vital Signs BP 154/95 (BP Location: Right Arm)   Pulse 98   Temp 97.9 F (36.6 C) (Oral)   Resp 16   Ht 5\' 7"  (1.702 m)   Wt 245 lb (111.1 kg)   SpO2 100%   BMI 38.37 kg/m   Physical Exam  Constitutional: He is oriented to person, place, and time. He appears well-developed and well-nourished. No distress.  HENT:  Head: Normocephalic and atraumatic.  Mouth/Throat: Oropharynx is clear and moist.  Neck: Normal range of motion. Neck supple.  Cardiovascular: Normal rate and regular rhythm.  Exam reveals no friction rub.   No murmur heard. Pulmonary/Chest: Effort normal and breath sounds normal. No respiratory distress. He has no wheezes. He has no rales.  Abdominal: Soft. Bowel sounds are normal. He exhibits no distension. There is no tenderness.  Genitourinary: Penis normal. No penile tenderness.  Genitourinary Comments: There are no obvious penile or testicular lesions. I see no discharge from the urethra.  Musculoskeletal: Normal range of motion. He exhibits no edema.  Neurological: He is alert and oriented to person, place, and time. Coordination normal.  Skin: Skin is warm and dry. He  is not diaphoretic.  Nursing note and vitals reviewed.    ED Treatments / Results  Labs (all labs ordered are listed, but only abnormal results are displayed) Labs Reviewed  URINALYSIS, ROUTINE W REFLEX MICROSCOPIC  RPR  HIV ANTIBODY (ROUTINE TESTING)  GC/CHLAMYDIA PROBE AMP () NOT AT [facility]    EKG  EKG Interpretation None       Radiology No results found.  Procedures Procedures (including critical care time)  Medications Ordered in ED Medications  penicillin g benzathine (BICILLIN LA) 1200000 UNIT/2ML injection 2.4 Million Units (not administered)      Initial Impression / Assessment and Plan / ED Course  I have reviewed the triage vital signs and the nursing notes.  Pertinent labs & imaging results that were available during my care of the patient were reviewed by me and considered in my medical decision making (see chart for details).     He will be treated for syphilis due to his reported recent exposure. HIV, RPR, GC, and chlamydia tests are all pending.  Final Clinical Impressions(s) / ED Diagnoses   Final diagnoses:  None    New Prescriptions New Prescriptions   No medications on file     Geoffery Lyons, MD 04/24/16 1217

## 2016-04-24 NOTE — Discharge Instructions (Signed)
No unprotected sexual activity for the next 2 weeks.  We will call you if your cultures indicate you require further treatment or action.

## 2016-04-24 NOTE — ED Triage Notes (Signed)
Had unprotected sex x 1 week ago and received a call from [facility] that was exposed to syphilis . Has had prior STD of same. Dysuria and discharge since yesterday

## 2016-04-24 NOTE — ED Notes (Signed)
Dr Delo in room with pt now. 

## 2016-04-25 LAB — GC/CHLAMYDIA PROBE AMP (~~LOC~~) NOT AT ARMC
Chlamydia: NEGATIVE
NEISSERIA GONORRHEA: NEGATIVE

## 2016-04-25 LAB — HIV ANTIBODY (ROUTINE TESTING W REFLEX): HIV SCREEN 4TH GENERATION: NONREACTIVE

## 2016-04-26 LAB — RPR, QUANT+TP ABS (REFLEX): T Pallidum Abs: POSITIVE — AB

## 2016-04-26 LAB — RPR: RPR: REACTIVE — AB

## 2016-10-01 ENCOUNTER — Encounter (HOSPITAL_BASED_OUTPATIENT_CLINIC_OR_DEPARTMENT_OTHER): Payer: Self-pay

## 2016-10-01 ENCOUNTER — Emergency Department (HOSPITAL_BASED_OUTPATIENT_CLINIC_OR_DEPARTMENT_OTHER)
Admission: EM | Admit: 2016-10-01 | Discharge: 2016-10-01 | Disposition: A | Discharge status: Home / Self Care | Payer: BLUE CROSS/BLUE SHIELD | Attending: Emergency Medicine | Admitting: Emergency Medicine

## 2016-10-01 DIAGNOSIS — F17210 Nicotine dependence, cigarettes, uncomplicated: Secondary | ICD-10-CM | POA: Insufficient documentation

## 2016-10-01 DIAGNOSIS — N342 Other urethritis: Secondary | ICD-10-CM | POA: Insufficient documentation

## 2016-10-01 LAB — URINALYSIS, ROUTINE W REFLEX MICROSCOPIC
BILIRUBIN URINE: NEGATIVE
GLUCOSE, UA: NEGATIVE mg/dL
HGB URINE DIPSTICK: NEGATIVE
KETONES UR: NEGATIVE mg/dL
NITRITE: NEGATIVE
Protein, ur: NEGATIVE mg/dL
SPECIFIC GRAVITY, URINE: 1.027 (ref 1.005–1.030)
pH: 5.5 (ref 5.0–8.0)

## 2016-10-01 LAB — URINALYSIS, MICROSCOPIC (REFLEX)

## 2016-10-01 MED ORDER — AZITHROMYCIN 250 MG PO TABS
1000.0000 mg | ORAL_TABLET | Freq: Once | ORAL | Status: AC
  Administered 2016-10-01: 1000 mg via ORAL
  Filled 2016-10-01: qty 4

## 2016-10-01 MED ORDER — CEFTRIAXONE SODIUM 250 MG IJ SOLR
250.0000 mg | Freq: Once | INTRAMUSCULAR | Status: AC
  Administered 2016-10-01: 250 mg via INTRAMUSCULAR
  Filled 2016-10-01: qty 250

## 2016-10-01 NOTE — ED Provider Notes (Signed)
[facility] Provider Note: Lowella Dell, MD, FACEP  CSN: [id number] MRN: [id number] ARRIVAL: 10/01/16 at 0004 ROOM: MH11/[facility]11   CHIEF COMPLAINT  Penile Discharge   HISTORY OF PRESENT ILLNESS  Chad Austin is a 26 y.o. male with a 2 day history of penile discharge. The discharge is milky white and moderate in quantity. He is also having a pinching sensation with urination that is not severe. He has had more severe infections in the past. He denies systemic symptoms such as fever or chills. He denies abdominal pain. He admits to unprotected sex.   Past Medical History:  Diagnosis Date  . STD (sexually transmitted disease)   . Syphilis     History reviewed. No pertinent surgical history.  Family History  Problem Relation Age of Onset  . Hypertension Other   . Diabetes Other     Social History  Substance Use Topics  . Smoking status: Current Every Day Smoker    Packs/day: 0.50    Types: Cigarettes  . Smokeless tobacco: Never Used  . Alcohol use Yes     Comment: occasional    Prior to Admission medications   Medication Sig Start Date End Date Taking? Authorizing Provider  cholecalciferol (VITAMIN D) 1000 units tablet Take 2,000 Units by mouth daily.    [provider]  MELATONIN PO Take 50 mg by mouth at bedtime as needed (for sleep).    [provider]    Allergies Patient has no known allergies.   REVIEW OF SYSTEMS  Negative except as noted here or in the History of Present Illness.   PHYSICAL EXAMINATION  Initial Vital Signs Blood pressure (!) 135/101, pulse 95, temperature 98.2 F (36.8 C), temperature source Oral, resp. rate 20, height 5\' 7"  (1.702 m), weight 111.1 kg (245 lb), SpO2 98 %.  Examination General: Well-developed, well-nourished male in no acute distress; appearance consistent with age of record HENT: normocephalic; atraumatic Eyes: pupils equal, round and reactive to light; extraocular muscles intact Neck:  supple Heart: regular rate and rhythm Lungs: clear to auscultation bilaterally Abdomen: soft; nondistended; nontender; bowel sounds present GU: Tanner 5 male, circumcised; watery urethral discharge Extremities: No deformity; full range of motion Neurologic: Awake, alert and oriented; motor function intact in all extremities and symmetric; no facial droop Skin: Warm and dry Psychiatric: Normal mood and affect   RESULTS  Summary of this visit's results, reviewed by myself:   EKG Interpretation  Date/Time:    Ventricular Rate:    PR Interval:    QRS Duration:   QT Interval:    QTC Calculation:   R Axis:     Text Interpretation:        Laboratory Studies: Results for orders placed or performed during the hospital encounter of 10/01/16 (from the past 24 hour(s))  Urinalysis, Routine w reflex microscopic- may I&O cath if menses     Status: Abnormal   Collection Time: 10/01/16 12:16 AM  Result Value Ref Range   Color, Urine YELLOW YELLOW   APPearance CLEAR CLEAR   Specific Gravity, Urine 1.027 1.005 - 1.030   pH 5.5 5.0 - 8.0   Glucose, UA NEGATIVE NEGATIVE mg/dL   Hgb urine dipstick NEGATIVE NEGATIVE   Bilirubin Urine NEGATIVE NEGATIVE   Ketones, ur NEGATIVE NEGATIVE mg/dL   Protein, ur NEGATIVE NEGATIVE mg/dL   Nitrite NEGATIVE NEGATIVE   Leukocytes, UA SMALL (A) NEGATIVE  Urinalysis, Microscopic (reflex)     Status: Abnormal   Collection Time: 10/01/16 12:16 AM  Result Value Ref Range   RBC / HPF 0-5 0 - 5 RBC/hpf   WBC, UA TOO NUMEROUS TO COUNT 0 - 5 WBC/hpf   Bacteria, UA FEW (A) NONE SEEN   Squamous Epithelial / LPF 0-5 (A) NONE SEEN   Mucous PRESENT    Ca Oxalate Crys, UA PRESENT    Imaging Studies: No results found.  ED COURSE  Nursing notes and initial vitals signs, including pulse oximetry, reviewed.  Vitals:   10/01/16 0016 10/01/16 0017  BP:  (!) 135/101  Pulse:  95  Resp:  20  Temp:  98.2 F (36.8 C)  TempSrc:  Oral  SpO2:  98%  Weight: 111.1  kg (245 lb)   Height: 5\' 7"  (1.702 m)     PROCEDURES    ED DIAGNOSES     ICD-10-CM   1. Urethritis N34.2        Jeven Topper, MD 10/01/16 [phone]

## 2016-10-01 NOTE — ED Triage Notes (Signed)
Pt c/o penile discharge, burning with urniation x 1 day. Pt reports possibly a lesion under his scrotum. Pt reports having unprotected sex.

## 2016-10-03 LAB — HIV ANTIBODY (ROUTINE TESTING W REFLEX): HIV SCREEN 4TH GENERATION: NONREACTIVE

## 2016-10-04 LAB — RPR, QUANT+TP ABS (REFLEX)
Rapid Plasma Reagin, Quant: 1:8 {titer} — ABNORMAL HIGH
T Pallidum Abs: POSITIVE — AB

## 2016-10-04 LAB — RPR: RPR: REACTIVE — AB

## 2016-12-30 ENCOUNTER — Encounter (HOSPITAL_BASED_OUTPATIENT_CLINIC_OR_DEPARTMENT_OTHER): Payer: Self-pay

## 2016-12-30 ENCOUNTER — Emergency Department (HOSPITAL_BASED_OUTPATIENT_CLINIC_OR_DEPARTMENT_OTHER)
Admission: EM | Admit: 2016-12-30 | Discharge: 2016-12-30 | Disposition: A | Discharge status: Home / Self Care | Payer: Self-pay | Attending: Emergency Medicine | Admitting: Emergency Medicine

## 2016-12-30 DIAGNOSIS — F17210 Nicotine dependence, cigarettes, uncomplicated: Secondary | ICD-10-CM | POA: Insufficient documentation

## 2016-12-30 DIAGNOSIS — R369 Urethral discharge, unspecified: Secondary | ICD-10-CM | POA: Insufficient documentation

## 2016-12-30 DIAGNOSIS — Z202 Contact with and (suspected) exposure to infections with a predominantly sexual mode of transmission: Secondary | ICD-10-CM | POA: Insufficient documentation

## 2016-12-30 MED ORDER — AZITHROMYCIN 250 MG PO TABS
1000.0000 mg | ORAL_TABLET | Freq: Once | ORAL | Status: AC
  Administered 2016-12-30: 1000 mg via ORAL
  Filled 2016-12-30: qty 4

## 2016-12-30 MED ORDER — CEFTRIAXONE SODIUM 250 MG IJ SOLR
250.0000 mg | Freq: Once | INTRAMUSCULAR | Status: AC
  Administered 2016-12-30: 250 mg via INTRAMUSCULAR
  Filled 2016-12-30: qty 250

## 2016-12-30 NOTE — ED Triage Notes (Signed)
C/o penile d/c-started yesterday-NAD-steady gait

## 2016-12-30 NOTE — ED Provider Notes (Signed)
[facility] Provider Note   CSN: [id number] Arrival date & time: 12/30/16  2011     History   Chief Complaint Chief Complaint  Patient presents with  . Penile Discharge    HPI Chad Austin is a 26 y.o. male who presents with a 2 day history of penile discharge. He reports he started feeling discharged 4 days ago, but did not see it until yesterday. He denies any pain, dysuria, testicular pain or scrotal swelling, abdominal pain, nausea, vomiting, fevers. He does have concern for STD exposure.  HPI  Past Medical History:  Diagnosis Date  . STD (sexually transmitted disease)   . Syphilis     There are no active problems to display for this patient.   History reviewed. No pertinent surgical history.     Home Medications    Prior to Admission medications   Not on File    Family History Family History  Problem Relation Age of Onset  . Hypertension Other   . Diabetes Other     Social History Social History  Substance Use Topics  . Smoking status: Current Every Day Smoker    Packs/day: 0.50    Types: Cigarettes  . Smokeless tobacco: Never Used  . Alcohol use Yes     Comment: occasional     Allergies   Patient has no known allergies.   Review of Systems Review of Systems  Constitutional: Negative for fever.  Gastrointestinal: Negative for abdominal pain, nausea and vomiting.  Genitourinary: Positive for discharge. Negative for dysuria, penile pain, penile swelling, scrotal swelling and testicular pain.     Physical Exam Updated Vital Signs BP (!) 148/100 (BP Location: Left Arm)   Pulse 100   Temp 98.4 F (36.9 C) (Oral)   Resp 16   Ht  (1.702 m)   Wt 119.7 kg (264 lb)   SpO2 97%   BMI 41.35 kg/m   Physical Exam  Constitutional: He appears well-developed and well-nourished. No distress.  HENT:  Head: Normocephalic and atraumatic.  Eyes: Conjunctivae are normal. Right eye exhibits no discharge. Left eye exhibits no  discharge. No scleral icterus.  Neck: Normal range of motion. Neck supple.  Cardiovascular: Normal rate, regular rhythm, normal heart sounds and intact distal pulses.  Exam reveals no gallop and no friction rub.   No murmur heard. Pulmonary/Chest: Effort normal and breath sounds normal. No respiratory distress. He has no wheezes. He has no rales.  Abdominal: Soft. Bowel sounds are normal. He exhibits no distension. There is no tenderness. There is no rebound and no guarding.  Genitourinary: Testes normal and penis normal. Right testis shows no swelling and no tenderness. Left testis shows no swelling and no tenderness. Circumcised. No penile tenderness. No discharge found.  Musculoskeletal: He exhibits no edema.  Neurological: He is alert. Coordination normal.  Skin: Skin is warm and dry. No rash noted. He is not diaphoretic. No pallor.  Psychiatric: He has a normal mood and affect.  Nursing note and vitals reviewed.    ED Treatments / Results  Labs (all labs ordered are listed, but only abnormal results are displayed) Labs Reviewed  RPR  HIV ANTIBODY (ROUTINE TESTING)  GC/CHLAMYDIA PROBE AMP ([place]) NOT AT [facility]    EKG  EKG Interpretation None       Radiology No results found.  Procedures Procedures (including critical care time)  Medications Ordered in ED Medications  cefTRIAXone (ROCEPHIN) injection 250 mg (250 mg Intramuscular Given 12/30/16 2101)  azithromycin (ZITHROMAX)  tablet 1,000 mg (1,000 mg Oral Given 12/30/16 2059)     Initial Impression / Assessment and Plan / ED Course  I have reviewed the triage vital signs and the nursing notes.  Pertinent labs & imaging results that were available during my care of the patient were reviewed by me and considered in my medical decision making (see chart for details).     Patient treated in the ED for STI with Rocephin and azithromycin. Patient advised to inform and treat all sexual partners. Pt advised on safe  sex practices and understands that they have GC/Chlamydia cultures pending and will result in 2-3 days. HIV and RPR sent. Pt encouraged to follow up at local health department for future STI checks. No concern for prostatitis or epididymitis. Discussed return precautions. Pt appears safe for discharge.    Final Clinical Impressions(s) / ED Diagnoses   Final diagnoses:  Penile discharge    New Prescriptions New Prescriptions   No medications on file     Verdis Prime 12/30/16 2106    Pricilla Loveless, MD 12/30/16 2317

## 2016-12-30 NOTE — Discharge Instructions (Signed)
You have been treated for gonorrhea and chlamydia today. You will be called in 3 days this days if any of your tests return positive. In that case, please make all of your sexual partners aware that they will need to be treated as well. Abstain from intercourse for one week until you have both been treated. Use condoms in the future to help prevent sexually transmitted disease and unwanted pregnancy. You can go to the health department in the future for free STD testing. ° °

## 2017-01-01 LAB — RPR: RPR Ser Ql: REACTIVE — AB

## 2017-01-01 LAB — RPR, QUANT+TP ABS (REFLEX)
Rapid Plasma Reagin, Quant: 1:16 {titer} — ABNORMAL HIGH
T Pallidum Abs: POSITIVE — AB

## 2017-01-01 LAB — HIV ANTIBODY (ROUTINE TESTING W REFLEX): HIV Screen 4th Generation wRfx: NONREACTIVE

## 2017-01-02 LAB — GC/CHLAMYDIA PROBE AMP (~~LOC~~) NOT AT ARMC
Chlamydia: NEGATIVE
Neisseria Gonorrhea: NEGATIVE

## 2017-01-09 ENCOUNTER — Encounter (HOSPITAL_BASED_OUTPATIENT_CLINIC_OR_DEPARTMENT_OTHER): Payer: Self-pay | Admitting: Emergency Medicine

## 2017-01-09 ENCOUNTER — Emergency Department (HOSPITAL_BASED_OUTPATIENT_CLINIC_OR_DEPARTMENT_OTHER)
Admission: EM | Admit: 2017-01-09 | Discharge: 2017-01-09 | Disposition: A | Discharge status: Home / Self Care | Payer: Self-pay | Attending: Emergency Medicine | Admitting: Emergency Medicine

## 2017-01-09 DIAGNOSIS — I10 Essential (primary) hypertension: Secondary | ICD-10-CM | POA: Insufficient documentation

## 2017-01-09 DIAGNOSIS — A539 Syphilis, unspecified: Secondary | ICD-10-CM | POA: Insufficient documentation

## 2017-01-09 DIAGNOSIS — F17210 Nicotine dependence, cigarettes, uncomplicated: Secondary | ICD-10-CM | POA: Insufficient documentation

## 2017-01-09 MED ORDER — PENICILLIN G BENZATHINE 1200000 UNIT/2ML IM SUSP
2.4000 10*6.[IU] | Freq: Once | INTRAMUSCULAR | Status: AC
  Administered 2017-01-09: 2.4 10*6.[IU] via INTRAMUSCULAR
  Filled 2017-01-09: qty 4

## 2017-01-09 NOTE — ED Provider Notes (Signed)
[facility] EMERGENCY DEPARTMENT Provider Note   CSN: [id number] Arrival date & time: 01/09/17  0749     History   Chief Complaint Chief Complaint  Patient presents with  . Follow-up    HPI Chad Austin is a 26 y.o. male.  The history is provided by the patient. No language interpreter was used.    Chad Austin is a 26 y.o. male who presents to the Emergency Department complaining of follow up.  He presents to the ED after being called a few days ago with an elevated RPR level.  He was seen and treated for penile discharge.  Overall his discharge has essentially resolved with only occasional white strings in his urine.  He feels well with no headache, confusion, fever, night sweats, weight loss, skin rash.  He has unprotected intercourse with male partners.  He has a hx/o syphilis with prior treatment at least 7 times at different facilities over the last 6 years.  He does not have a PCP.  Sxs are mild in nature.    Past Medical History:  Diagnosis Date  . STD (sexually transmitted disease)   . Syphilis     There are no active problems to display for this patient.   No past surgical history on file.     Home Medications    Prior to Admission medications   Not on File    Family History Family History  Problem Relation Age of Onset  . Hypertension Other   . Diabetes Other     Social History Social History  Substance Use Topics  . Smoking status: Current Every Day Smoker    Packs/day: 0.50    Types: Cigarettes  . Smokeless tobacco: Never Used  . Alcohol use Yes     Comment: occasional     Allergies   Patient has no known allergies.   Review of Systems Review of Systems  All other systems reviewed and are negative.    Physical Exam Updated Vital Signs BP (!) 153/97   Pulse 85   Temp 98.4 F (36.9 C) (Oral)   Resp 16   Ht 5\' 7"  (1.702 m)   Wt 120.2 kg (265 lb)   SpO2 100%   BMI 41.50 kg/m   Physical Exam  Constitutional:  He is oriented to person, place, and time. He appears well-developed and well-nourished.  HENT:  Head: Normocephalic and atraumatic.  Cardiovascular: Normal rate and regular rhythm.   Pulmonary/Chest: Effort normal. No respiratory distress.  Musculoskeletal: Normal range of motion.  Neurological: He is alert and oriented to person, place, and time.  Skin: Skin is warm and dry.  Psychiatric: He has a normal mood and affect. His behavior is normal.  Nursing note and vitals reviewed.    ED Treatments / Results  Labs (all labs ordered are listed, but only abnormal results are displayed) Labs Reviewed - No data to display  EKG  EKG Interpretation None       Radiology No results found.  Procedures Procedures (including critical care time)  Medications Ordered in ED Medications  penicillin g benzathine (BICILLIN LA) 1200000 UNIT/2ML injection 2.4 Million Units (2.4 Million Units Intramuscular Given 01/09/17 0859)     Initial Impression / Assessment and Plan / ED Course  I have reviewed the triage vital signs and the nursing notes.  Pertinent labs & imaging results that were available during my care of the patient were reviewed by me and considered in my medical decision making (see chart for  details).     Pt here following call back for elevated RPR.  Pt with no current sxs.  Will treat for recurrent syphilis.  Discussed informing his partners, safe sex practices.  Discussed follow up with the health department for STD checks and maintaining a health care home instead of visiting multiple different facilities.  His blood pressure is noted to be elevated in the ED and he is asymptomatic.  Discussed with patient hypertension and importance of establishing a family doctor.  Discussed what he can do to try to lower his blood pressure without medications.    Final Clinical Impressions(s) / ED Diagnoses   Final diagnoses:  Syphilis  Essential hypertension    New  Prescriptions New Prescriptions   No medications on file     Tilden Fossa, MD 01/09/17 [phone]

## 2017-01-09 NOTE — ED Notes (Addendum)
Pt denies any symptoms of allergic reaction to PCN.  Pt educated in length regarding HTN.  Pt educated on low sodium diet, given handouts for heart healthy diet, low sodium diet/heart healthy diet.  Pt also educated on hidden sodium in foods.  Pt educated about risky behaviors regarding unprotected sex.  Pt advised to use condoms, suggested finding brands that are thin or more natural feeling.  Advised on [facility] planning [facility], [facility]  Pt asked specific questions and were answered,  Pt gave verbal understanding of discharge instructions.

## 2017-01-09 NOTE — ED Triage Notes (Signed)
Pt was called and told his syphilis level was positive.  Pt here for treatment.

## 2017-01-23 ENCOUNTER — Encounter: Payer: Self-pay | Admitting: Nurse Practitioner

## 2017-01-23 ENCOUNTER — Ambulatory Visit: Payer: Self-pay | Attending: Nurse Practitioner | Admitting: Nurse Practitioner

## 2017-01-23 VITALS — BP 135/86 | HR 84 | Temp 98.6°F | Resp 18 | Ht 67.0 in | Wt 258.4 lb

## 2017-01-23 DIAGNOSIS — Z7252 High risk homosexual behavior: Secondary | ICD-10-CM

## 2017-01-23 DIAGNOSIS — Z8619 Personal history of other infectious and parasitic diseases: Secondary | ICD-10-CM

## 2017-01-23 DIAGNOSIS — R030 Elevated blood-pressure reading, without diagnosis of hypertension: Secondary | ICD-10-CM

## 2017-01-23 DIAGNOSIS — F17210 Nicotine dependence, cigarettes, uncomplicated: Secondary | ICD-10-CM

## 2017-01-23 NOTE — Progress Notes (Signed)
CC: Elevated Blood Pressure readings  HPI: Chad Austin is a 26 y.o. male here today with concerns of elevated blood pressure.  According to the patient he has had multiple prior ED visits in which his blood pressure has been elevated. He was instructed to find a medical home in order to be able to have his blood pressure assessed routinely.  Transient elevated blood pressure Patient here for follow-up of elevated blood pressure. He has recently started exercising and has also a low salt diet.  He has lost 7 pounds since his last ED visit on 01-09-2017.  He does not monitor his blood pressure at home. Cardiac symptoms chest pressure/discomfort (currently denies). Patient denies chest pain, claudication, dyspnea, exertional chest pressure/discomfort, fatigue, irregular heart beat, lower extremity edema, palpitations, paroxysmal nocturnal dyspnea, syncope and tachypnea.  Cardiovascular risk factors: male gender, obesity (BMI >= 30 kg/m2) and smoking/ tobacco exposure. Use of agents associated with hypertension: none. History of target organ damage: none. BP Readings from Last 3 Encounters:  01/23/17 135/86  01/09/17 (!) 153/97  12/30/16 (!) 148/100    Smoking Cessation/Tobacco Dependence Started smoking at the age of 26 (10 Years now). Currently smokes half a pack of cigarettes a day. He has stopped smoking in the past for 2 months then resumed. He is not interested in quitting right now.   History of Syphillis Review of ED notes on 01-09-2017 state patient has had prior treatment for syphillis at least 7 time sin the past 6 years. He has been previously counseled and treated regarding STD exposure. His last treatment was 01-09-2017. He currently denies any rashes, lesions or symptoms of STD/syphillis exposure.    Problem  Transient Elevated Blood Pressure  History of Syphilis  Tobacco Dependence Due to Cigarettes  High Risk Homosexual Behavior    ALLERGIES: No Known Allergies  PAST  MEDICAL HISTORY: Past Medical History:  Diagnosis Date  . STD (sexually transmitted disease)   . Syphilis     SOCIAL HISTORY Social History   Socioeconomic History  . Marital status: Single    Spouse name: Not on file  . Number of children: Not on file  . Years of education: Not on file  . Highest education level: Not on file  Social Needs  . Financial resource strain: Not on file  . Food insecurity - worry: Not on file  . Food insecurity - inability: Not on file  . Transportation needs - medical: Not on file  . Transportation needs - non-medical: Not on file  Occupational History  . Not on file  Tobacco Use  . Smoking status: Former Smoker    Packs/day: 0.50    Types: Cigarettes  . Smokeless tobacco: Never Used  Substance and Sexual Activity  . Alcohol use: Yes    Comment: occasional  . Drug use: No  . Sexual activity: Not on file  Other Topics Concern  . Not on file  Social History Narrative  . Not on file    FAMILY HISTORY Family History  Problem Relation Age of Onset  . Hypertension Other   . Diabetes Other      MEDICATIONS AT HOME: Prior to Admission medications   Not on File      Review of Systems  Constitutional: Negative for chills, fever, malaise/fatigue and weight loss.  HENT: Negative.   Eyes: Negative.   Respiratory: Negative for cough and shortness of breath.   Cardiovascular: Positive for chest pain (intermittent; denies currently). Negative for palpitations, orthopnea and leg  swelling.  Gastrointestinal: Negative for abdominal pain.  Genitourinary: Negative.   Skin: Negative for itching and rash.  Neurological: Negative.  Negative for dizziness, seizures and headaches.  Psychiatric/Behavioral: Negative.  Negative for suicidal ideas.     Objective:   Vitals:   01/23/17 0852  BP: 135/86  Pulse: 84  Resp: 18  Temp: 98.6 F (37 C)  SpO2: 98%    Physical Exam  Constitutional: He is oriented to person, place, and time and  well-developed, well-nourished, and in no distress.  HENT:  Head: Normocephalic.  Eyes: EOM are normal.  Neck: Normal range of motion.  Cardiovascular: Normal rate, regular rhythm and normal heart sounds. Exam reveals no gallop and no friction rub.  No murmur heard. Pulmonary/Chest: Breath sounds normal. No respiratory distress. He has no wheezes. He has no rales. He exhibits no tenderness.  Abdominal: Soft. Bowel sounds are normal.  Musculoskeletal: Normal range of motion.  Neurological: He is alert and oriented to person, place, and time.  Skin: Skin is warm and dry.  Psychiatric: Mood, memory, affect and judgment normal.    Lab Results  Component Value Date   WBC 13.5 (H) 10/07/2011   HGB 16.7 04/25/2013   HCT 49.0 04/25/2013   MCV 87.6 10/07/2011   PLT 223 10/07/2011   Lab Results  Component Value Date   CREATININE 0.90 04/25/2013   BUN 8 04/25/2013   NA 139 04/25/2013   K 4.0 04/25/2013   CL 101 04/25/2013        Assessment and plan:    Chad Austin was seen today for new patient (initial visit).  Diagnoses and all orders for this visit:  Transient elevated blood pressure Continue to monitor blood pressure at follow-up office visits.  Patient was instructed to routinely check his blood pressure at local pharmacies.  Patient was educated on dietary and exercise modifications.  Exercise at least 150 minutes/week and be sure to follow a D A S H diet as well as a low fat low cholesterol diet.  Tobacco dependence due to cigarettes Chad Austin was counseled on the dangers of tobacco use, and was advised to quit. Reviewed strategies to maximize success, including removing cigarettes and smoking materials from environment, stress management and support of family/friends as well as pharmacological alternatives including: Wellbutrin, Chantix, Nicotine patch, Nicotine gum or lozenges. Smoking cessation support: smoking cessation hotline: 1-800-QUIT-NOW.  Smoking cessation classes are  also available through [facility]. Call [phone] or visit our website at [email or web address].   Spent 4 minutes counseling on smoking cessation and patient is not ready to quit.  History of syphilis/High risk homosexual behavior Patient was counseled on high risk sexual behaviors and the importance of contraceptive use (specifically condoms) to avoid contracting STDs.  Patient was also instructed to avoid sexual encounters with multiple partners.   Patient has been counseled extensively about nutrition and exercise. Other issues discussed during this visit include: low cholesterol diet, weight control with exercise at least 150 minutes per week, foot care, annual eye examinations at Ophthalmology, importance of adherence with medications and regular follow-up.    Return in about 6 weeks (around 03/06/2017) for BP recheck, physical and fasting labs .  The patient was given clear instructions to go to ER or return to medical center if symptoms don't improve, worsen or new problems develop. The patient verbalized understanding.      Claiborne Rigg, FNP-BC, [facility] and [facility] [place], [place] [phone]

## 2017-01-23 NOTE — Progress Notes (Signed)
Patient is here with concern with hypertension.

## 2017-01-23 NOTE — Patient Instructions (Addendum)
DASH Eating Plan DASH stands for "Dietary Approaches to Stop Hypertension." The DASH eating plan is a healthy eating plan that has been shown to reduce high blood pressure (hypertension). It may also reduce your risk for type 2 diabetes, heart disease, and stroke. The DASH eating plan may also help with weight loss. What are tips for following this plan? General guidelines  Avoid eating more than 2,300 mg (milligrams) of salt (sodium) a day. If you have hypertension, you may need to reduce your sodium intake to 1,500 mg a day.  Limit alcohol intake to no more than 1 drink a day for nonpregnant women and 2 drinks a day for men. One drink equals 12 oz of beer, 5 oz of wine, or 1 oz of hard liquor.  Work with your health care provider to maintain a healthy body weight or to lose weight. Ask what an ideal weight is for you.  Get at least 30 minutes of exercise that causes your heart to beat faster (aerobic exercise) most days of the week. Activities may include walking, swimming, or biking.  Work with your health care provider or diet and nutrition specialist (dietitian) to adjust your eating plan to your individual calorie needs. Reading food labels  Check food labels for the amount of sodium per serving. Choose foods with less than 5 percent of the Daily Value of sodium. Generally, foods with less than 300 mg of sodium per serving fit into this eating plan.  To find whole grains, look for the word "whole" as the first word in the ingredient list. Shopping  Buy products labeled as "low-sodium" or "no salt added."  Buy fresh foods. Avoid canned foods and premade or frozen meals. Cooking  Avoid adding salt when cooking. Use salt-free seasonings or herbs instead of table salt or sea salt. Check with your health care provider or pharmacist before using salt substitutes.  Do not fry foods. Cook foods using healthy methods such as baking, boiling, grilling, and broiling instead.  Cook with  heart-healthy oils, such as olive, canola, soybean, or sunflower oil. Meal planning   Eat a balanced diet that includes: ? 5 or more servings of fruits and vegetables each day. At each meal, try to fill half of your plate with fruits and vegetables. ? Up to 6-8 servings of whole grains each day. ? Less than 6 oz of lean meat, poultry, or fish each day. A 3-oz serving of meat is about the same size as a deck of cards. One egg equals 1 oz. ? 2 servings of low-fat dairy each day. ? A serving of nuts, seeds, or beans 5 times each week. ? Heart-healthy fats. Healthy fats called Omega-3 fatty acids are found in foods such as flaxseeds and coldwater fish, like sardines, salmon, and mackerel.  Limit how much you eat of the following: ? Canned or prepackaged foods. ? Food that is high in trans fat, such as fried foods. ? Food that is high in saturated fat, such as fatty meat. ? Sweets, desserts, sugary drinks, and other foods with added sugar. ? Full-fat dairy products.  Do not salt foods before eating.  Try to eat at least 2 vegetarian meals each week.  Eat more home-cooked food and less restaurant, buffet, and fast food.  When eating at a restaurant, ask that your food be prepared with less salt or no salt, if possible. What foods are recommended? The items listed may not be a complete list. Talk with your dietitian about what   dietary choices are best for you. Grains Whole-grain or whole-wheat bread. Whole-grain or whole-wheat pasta. Brown rice. Oatmeal. Quinoa. Bulgur. Whole-grain and low-sodium cereals. Pita bread. Low-fat, low-sodium crackers. Whole-wheat flour tortillas. Vegetables Fresh or frozen vegetables (raw, steamed, roasted, or grilled). Low-sodium or reduced-sodium tomato and vegetable juice. Low-sodium or reduced-sodium tomato sauce and tomato paste. Low-sodium or reduced-sodium canned vegetables. Fruits All fresh, dried, or frozen fruit. Canned fruit in natural juice (without  added sugar). Meat and other protein foods Skinless chicken or turkey. Ground chicken or turkey. Pork with fat trimmed off. Fish and seafood. Egg whites. Dried beans, peas, or lentils. Unsalted nuts, nut butters, and seeds. Unsalted canned beans. Lean cuts of beef with fat trimmed off. Low-sodium, lean deli meat. Dairy Low-fat (1%) or fat-free (skim) milk. Fat-free, low-fat, or reduced-fat cheeses. Nonfat, low-sodium ricotta or cottage cheese. Low-fat or nonfat yogurt. Low-fat, low-sodium cheese. Fats and oils Soft margarine without trans fats. Vegetable oil. Low-fat, reduced-fat, or light mayonnaise and salad dressings (reduced-sodium). Canola, safflower, olive, soybean, and sunflower oils. Avocado. Seasoning and other foods Herbs. Spices. Seasoning mixes without salt. Unsalted popcorn and pretzels. Fat-free sweets. What foods are not recommended? The items listed may not be a complete list. Talk with your dietitian about what dietary choices are best for you. Grains Baked goods made with fat, such as croissants, muffins, or some breads. Dry pasta or rice meal packs. Vegetables Creamed or fried vegetables. Vegetables in a cheese sauce. Regular canned vegetables (not low-sodium or reduced-sodium). Regular canned tomato sauce and paste (not low-sodium or reduced-sodium). Regular tomato and vegetable juice (not low-sodium or reduced-sodium). Pickles. Olives. Fruits Canned fruit in a light or heavy syrup. Fried fruit. Fruit in cream or butter sauce. Meat and other protein foods Fatty cuts of meat. Ribs. Fried meat. Bacon. Sausage. Bologna and other processed lunch meats. Salami. Fatback. Hotdogs. Bratwurst. Salted nuts and seeds. Canned beans with added salt. Canned or smoked fish. Whole eggs or egg yolks. Chicken or turkey with skin. Dairy Whole or 2% milk, cream, and half-and-half. Whole or full-fat cream cheese. Whole-fat or sweetened yogurt. Full-fat cheese. Nondairy creamers. Whipped toppings.  Processed cheese and cheese spreads. Fats and oils Butter. Stick margarine. Lard. Shortening. Ghee. Bacon fat. Tropical oils, such as coconut, palm kernel, or palm oil. Seasoning and other foods Salted popcorn and pretzels. Onion salt, garlic salt, seasoned salt, table salt, and sea salt. Worcestershire sauce. Tartar sauce. Barbecue sauce. Teriyaki sauce. Soy sauce, including reduced-sodium. Steak sauce. Canned and packaged gravies. Fish sauce. Oyster sauce. Cocktail sauce. Horseradish that you find on the shelf. Ketchup. Mustard. Meat flavorings and tenderizers. Bouillon cubes. Hot sauce and Tabasco sauce. Premade or packaged marinades. Premade or packaged taco seasonings. Relishes. Regular salad dressings. Where to find more information:  National Heart, Lung, and Blood Institute: www.nhlbi.nih.gov  American Heart Association: www.heart.org Summary  The DASH eating plan is a healthy eating plan that has been shown to reduce high blood pressure (hypertension). It may also reduce your risk for type 2 diabetes, heart disease, and stroke.  With the DASH eating plan, you should limit salt (sodium) intake to 2,300 mg a day. If you have hypertension, you may need to reduce your sodium intake to 1,500 mg a day.  When on the DASH eating plan, aim to eat more fresh fruits and vegetables, whole grains, lean proteins, low-fat dairy, and heart-healthy fats.  Work with your health care provider or diet and nutrition specialist (dietitian) to adjust your eating plan to your individual   calorie needs. This information is not intended to replace advice given to you by your health care provider. Make sure you discuss any questions you have with your health care provider. Document Released: 02/24/2011 Document Revised: 02/29/2016 Document Reviewed: 02/29/2016 Elsevier Interactive Patient Education  2017 [employer] Inc[employer] Hypertension Hypertension is another name for high blood pressure. High blood pressure forces  your heart to work harder to pump blood. This can cause problems over time. There are two numbers in a blood pressure reading. There is a top number (systolic) over a bottom number (diastolic). It is best to have a blood pressure below 120/80. Healthy choices can help lower your blood pressure. You may need medicine to help lower your blood pressure if:  Your blood pressure cannot be lowered with healthy choices.  Your blood pressure is higher than 130/80.  Follow these instructions at home: Eating and drinking  If directed, follow the DASH eating plan. This diet includes: ? Filling half of your plate at each meal with fruits and vegetables. ? Filling one quarter of your plate at each meal with whole grains. Whole grains include whole wheat pasta, brown rice, and whole grain bread. ? Eating or drinking low-fat dairy products, such as skim milk or low-fat yogurt. ? Filling one quarter of your plate at each meal with low-fat (lean) proteins. Low-fat proteins include fish, skinless chicken, eggs, beans, and tofu. ? Avoiding fatty meat, cured and processed meat, or chicken with skin. ? Avoiding premade or processed food.  Eat less than 1,500 mg of salt (sodium) a day.  Limit alcohol use to no more than 1 drink a day for nonpregnant women and 2 drinks a day for men. One drink equals 12 oz of beer, 5 oz of wine, or 1 oz of hard liquor. Lifestyle  Work with your doctor to stay at a healthy weight or to lose weight. Ask your doctor what the best weight is for you.  Get at least 30 minutes of exercise that causes your heart to beat faster (aerobic exercise) most days of the week. This may include walking, swimming, or biking.  Get at least 30 minutes of exercise that strengthens your muscles (resistance exercise) at least 3 days a week. This may include lifting weights or pilates.  Do not use any products that contain nicotine or tobacco. This includes cigarettes and e-cigarettes. If you need  help quitting, ask your doctor.  Check your blood pressure at home as told by your doctor.  Keep all follow-up visits as told by your doctor. This is important. Medicines  Take over-the-counter and prescription medicines only as told by your doctor. Follow directions carefully.  Do not skip doses of blood pressure medicine. The medicine does not work as well if you skip doses. Skipping doses also puts you at risk for problems.  Ask your doctor about side effects or reactions to medicines that you should watch for. Contact a doctor if:  You think you are having a reaction to the medicine you are taking.  You have headaches that keep coming back (recurring).  You feel dizzy.  You have swelling in your ankles.  You have trouble with your vision. Get help right away if:  You get a very bad headache.  You start to feel confused.  You feel weak or numb.  You feel faint.  You get very bad pain in your: ? Chest. ? Belly (abdomen).  You throw up (vomit) more than once.  You have trouble breathing. Summary    Summary  Hypertension is another name for high blood pressure.  Making healthy choices can help lower blood pressure. If your blood pressure cannot be controlled with healthy choices, you may need to take medicine. This information is not intended to replace advice given to you by your health care provider. Make sure you discuss any questions you have with your health care provider. Document Released: 08/24/2007 Document Revised: 02/03/2016 Document Reviewed: 02/03/2016 Elsevier Interactive Patient Education  2018 [employer].  Coping with Quitting Smoking Quitting smoking is a physical and mental challenge. You will face cravings, withdrawal symptoms, and temptation. Before quitting, work with your health care provider to make a plan that can help you cope. Preparation can help you quit and keep you from giving in. How can I cope with cravings? Cravings usually last for  5-10 minutes. If you get through it, the craving will pass. Consider taking the following actions to help you cope with cravings:  Keep your mouth busy: ? Chew sugar-free gum. ? Suck on hard candies or a straw. ? Brush your teeth.  Keep your hands and body busy: ? Immediately change to a different activity when you feel a craving. ? Squeeze or play with a ball. ? Do an activity or a hobby, like making bead jewelry, practicing needlepoint, or working with wood. ? Mix up your normal routine. ? Take a short exercise break. Go for a quick walk or run up and down stairs. ? Spend time in public places where smoking is not allowed.  Focus on doing something kind or helpful for someone else.  Call a friend or family member to talk during a craving.  Join a support group.  Call a quit line, such as 1-800-QUIT-NOW.  Talk with your health care provider about medicines that might help you cope with cravings and make quitting easier for you.  How can I deal with withdrawal symptoms? Your body may experience negative effects as it tries to get used to not having nicotine in the system. These effects are called withdrawal symptoms. They may include:  Feeling hungrier than normal.  Trouble concentrating.  Irritability.  Trouble sleeping.  Feeling depressed.  Restlessness and agitation.  Craving a cigarette.  To manage withdrawal symptoms:  Avoid places, people, and activities that trigger your cravings.  Remember why you want to quit.  Get plenty of sleep.  Avoid coffee and other caffeinated drinks. These may worsen some of your symptoms.  How can I handle social situations? Social situations can be difficult when you are quitting smoking, especially in the first few weeks. To manage this, you can:  Avoid parties, bars, and other social situations where people might be smoking.  Avoid alcohol.  Leave right away if you have the urge to smoke.  Explain to your family and  friends that you are quitting smoking. Ask for understanding and support.  Plan activities with friends or family where smoking is not an option.  What are some ways I can cope with stress? Wanting to smoke may cause stress, and stress can make you want to smoke. Find ways to manage your stress. Relaxation techniques can help. For example:  Breathe slowly and deeply, in through your nose and out through your mouth.  Listen to soothing, relaxing music.  Talk with a family member or friend about your stress.  Light a candle.  Soak in a bath or take a shower.  Think about a peaceful place.  What are some ways I can prevent  weight gain? Be aware that many people gain weight after they quit smoking. However, not everyone does. To keep from gaining weight, have a plan in place before you quit and stick to the plan after you quit. Your plan should include:  Having healthy snacks. When you have a craving, it may help to: ? Eat plain popcorn, crunchy carrots, celery, or other cut vegetables. ? Chew sugar-free gum.  Changing how you eat: ? Eat small portion sizes at meals. ? Eat 4-6 small meals throughout the day instead of 1-2 large meals a day. ? Be mindful when you eat. Do not watch television or do other things that might distract you as you eat.  Exercising regularly: ? Make time to exercise each day. If you do not have time for a long workout, do short bouts of exercise for 5-10 minutes several times a day. ? Do some form of strengthening exercise, like weight lifting, and some form of aerobic exercise, like running or swimming.  Drinking plenty of water or other low-calorie or no-calorie drinks. Drink 6-8 glasses of water daily, or as much as instructed by your health care provider.  Summary  Quitting smoking is a physical and mental challenge. You will face cravings, withdrawal symptoms, and temptation to smoke again. Preparation can help you as you go through these  challenges.  You can cope with cravings by keeping your mouth busy (such as by chewing gum), keeping your body and hands busy, and making calls to family, friends, or a helpline for people who want to quit smoking.  You can cope with withdrawal symptoms by avoiding places where people smoke, avoiding drinks with caffeine, and getting plenty of rest.  Ask your health care provider about the different ways to prevent weight gain, avoid stress, and handle social situations. This information is not intended to replace advice given to you by your health care provider. Make sure you discuss any questions you have with your health care provider. Document Released: 03/04/2016 Document Revised: 03/04/2016 Document Reviewed: 03/04/2016 Elsevier Interactive Patient Education  2018 [employer]  Steps to Quit Smoking Smoking tobacco can be bad for your health. It can also affect almost every organ in your body. Smoking puts you and people around you at risk for many serious long-lasting (chronic) diseases. Quitting smoking is hard, but it is one of the best things that you can do for your health. It is never too late to quit. What are the benefits of quitting smoking? When you quit smoking, you lower your risk for getting serious diseases and conditions. They can include:  Lung cancer or lung disease.  Heart disease.  Stroke.  Heart attack.  Not being able to have children (infertility).  Weak bones (osteoporosis) and broken bones (fractures).  If you have coughing, wheezing, and shortness of breath, those symptoms may get better when you quit. You may also get sick less often. If you are pregnant, quitting smoking can help to lower your chances of having a baby of low birth weight. What can I do to help me quit smoking? Talk with your doctor about what can help you quit smoking. Some things you can do (strategies) include:  Quitting smoking totally, instead of slowly cutting back how much you  smoke over a period of time.  Going to in-person counseling. You are more likely to quit if you go to many counseling sessions.  Using resources and support systems, such as: ? Agricultural engineernline chats with a Veterinary surgeoncounselor. ? Phone quitlines. ?  Printed Materials engineer. ? Support groups or group counseling. ? Text messaging programs. ? Mobile phone apps or applications.  Taking medicines. Some of these medicines may have nicotine in them. If you are pregnant or breastfeeding, do not take any medicines to quit smoking unless your doctor says it is okay. Talk with your doctor about counseling or other things that can help you.  Talk with your doctor about using more than one strategy at the same time, such as taking medicines while you are also going to in-person counseling. This can help make quitting easier. What things can I do to make it easier to quit? Quitting smoking might feel very hard at first, but there is a lot that you can do to make it easier. Take these steps:  Talk to your family and friends. Ask them to support and encourage you.  Call phone quitlines, reach out to support groups, or work with a Veterinary surgeon.  Ask people who smoke to not smoke around you.  Avoid places that make you want (trigger) to smoke, such as: ? Bars. ? Parties. ? Smoke-break areas at work.  Spend time with people who do not smoke.  Lower the stress in your life. Stress can make you want to smoke. Try these things to help your stress: ? Getting regular exercise. ? Deep-breathing exercises. ? Yoga. ? Meditating. ? Doing a body scan. To do this, close your eyes, focus on one area of your body at a time from head to toe, and notice which parts of your body are tense. Try to relax the muscles in those areas.  Download or buy apps on your mobile phone or tablet that can help you stick to your quit plan. There are many free apps, such as QuitGuide from the [employer] developer for Disease Control and Prevention). You can  find more support from smokefree.gov and other websites.  This information is not intended to replace advice given to you by your health care provider. Make sure you discuss any questions you have with your health care provider. Document Released: 01/01/2009 Document Revised: 11/03/2015 Document Reviewed: 07/22/2014 Elsevier Interactive Patient Education  2018 [employer].

## 2017-01-31 ENCOUNTER — Ambulatory Visit: Payer: Self-pay | Attending: Internal Medicine

## 2017-03-06 ENCOUNTER — Ambulatory Visit: Payer: Self-pay | Admitting: Nurse Practitioner

## 2017-03-24 ENCOUNTER — Other Ambulatory Visit: Payer: Self-pay

## 2017-03-24 ENCOUNTER — Encounter (HOSPITAL_COMMUNITY): Payer: Self-pay

## 2017-03-24 ENCOUNTER — Emergency Department (HOSPITAL_COMMUNITY)
Admission: EM | Admit: 2017-03-24 | Discharge: 2017-03-24 | Disposition: A | Discharge status: Home / Self Care | Payer: Self-pay | Attending: Emergency Medicine | Admitting: Emergency Medicine

## 2017-03-24 DIAGNOSIS — Z87891 Personal history of nicotine dependence: Secondary | ICD-10-CM | POA: Insufficient documentation

## 2017-03-24 DIAGNOSIS — R369 Urethral discharge, unspecified: Secondary | ICD-10-CM | POA: Insufficient documentation

## 2017-03-24 DIAGNOSIS — I10 Essential (primary) hypertension: Secondary | ICD-10-CM

## 2017-03-24 LAB — URINALYSIS, ROUTINE W REFLEX MICROSCOPIC
BACTERIA UA: NONE SEEN
BILIRUBIN URINE: NEGATIVE
Glucose, UA: NEGATIVE mg/dL
Hgb urine dipstick: NEGATIVE
KETONES UR: NEGATIVE mg/dL
Nitrite: NEGATIVE
PROTEIN: NEGATIVE mg/dL
SPECIFIC GRAVITY, URINE: 1.026 (ref 1.005–1.030)
pH: 5 (ref 5.0–8.0)

## 2017-03-24 MED ORDER — STERILE WATER FOR INJECTION IJ SOLN
INTRAMUSCULAR | Status: AC
  Administered 2017-03-24: 10 mL
  Filled 2017-03-24: qty 10

## 2017-03-24 MED ORDER — CEFTRIAXONE SODIUM 250 MG IJ SOLR
250.0000 mg | Freq: Once | INTRAMUSCULAR | Status: AC
  Administered 2017-03-24: 250 mg via INTRAMUSCULAR
  Filled 2017-03-24: qty 250

## 2017-03-24 MED ORDER — AZITHROMYCIN 250 MG PO TABS
1000.0000 mg | ORAL_TABLET | Freq: Once | ORAL | Status: AC
  Administered 2017-03-24: 1000 mg via ORAL
  Filled 2017-03-24: qty 4

## 2017-03-24 NOTE — ED Provider Notes (Signed)
[facility]-EMERGENCY DEPT Provider Note   CSN: [id number] Arrival date & time: 03/24/17  1159     History   Chief Complaint Chief Complaint  Patient presents with  . Penile Discharge    HPI Chad Austin is a 27 y.o. male.  HPI  Patient is a 27 year old male with a history of sexually transmitted infection, and syphilis presenting for white penile discharge.  Patient reports he has noticed this for the past 2 days.  Patient reports that he does not have a known exposure, however he has previously been diagnosed with syphilis.  Patient practices MSM and reports that practices penetrative anal intercourse and not receptive.  Patient reports he has had some mild dysuria after urination.  Patient denies lower abdominal pain, nausea, vomiting, fever, chills, rectal pain, testicular pain or swelling.  Patient reports that he has had approximately 3 partners in the last month, 6 in the last 3 months, and 12-13 in the last 6 months.  Patient intermittently uses condoms.  Past Medical History:  Diagnosis Date  . STD (sexually transmitted disease)   . Syphilis     Patient Active Problem List   Diagnosis Date Noted  . Transient elevated blood pressure 01/23/2017  . History of syphilis 01/23/2017  . Tobacco dependence due to cigarettes 01/23/2017  . High risk homosexual behavior 01/23/2017    History reviewed. No pertinent surgical history.     Home Medications    Prior to Admission medications   Not on File    Family History Family History  Problem Relation Age of Onset  . Hypertension Other   . Diabetes Other     Social History Social History   Tobacco Use  . Smoking status: Former Smoker    Packs/day: 0.50    Types: Cigarettes  . Smokeless tobacco: Never Used  Substance Use Topics  . Alcohol use: Yes    Comment: occasional  . Drug use: No     Allergies   Patient has no known allergies.   Review of Systems Review of Systems    Constitutional: Negative for chills and fever.  Gastrointestinal: Negative for abdominal pain, nausea and vomiting.  Genitourinary: Positive for discharge and dysuria. Negative for difficulty urinating, genital sores, penile swelling, scrotal swelling, testicular pain and urgency.     Physical Exam Updated Vital Signs BP 138/83 (BP Location: Left Arm)   Pulse (!) 43   Temp 98.5 F (36.9 C) (Oral)   Resp 16   Ht 5\' 7"  (1.702 m)   Wt 115.7 kg (255 lb)   SpO2 100%   BMI 39.94 kg/m   Physical Exam  Constitutional: He appears well-developed and well-nourished. No distress.  Sitting comfortably in bed.  HENT:  Head: Normocephalic and atraumatic.  Eyes: Conjunctivae and EOM are normal. Right eye exhibits no discharge. Left eye exhibits no discharge.  EOMs normal to gross examination.  Neck: Normal range of motion.  Cardiovascular: Normal rate and regular rhythm.  Intact, 2+ radial pulse.  Normal rate at 62  Pulmonary/Chest: Effort normal and breath sounds normal.  Normal respiratory effort. Patient converses comfortably. No audible wheeze or stridor.  Abdominal: He exhibits no distension. There is no tenderness.  Genitourinary:  Genitourinary Comments: Exam performed with nurse, Jari Favre, present.  No lesions of the groin, testicles, or penis.  No inguinal lymphadenopathy.  No tenderness palpation of testes or epididymis.  No tenderness palpation penile shaft.  Small amount of milky active penile discharge.  Musculoskeletal: Normal range of  motion.  Neurological: He is alert.  Cranial nerves intact to gross observation. Patient moves extremities without difficulty.  Skin: Skin is warm and dry. He is not diaphoretic.  Psychiatric: He has a normal mood and affect. His behavior is normal. Judgment and thought content normal.  Nursing note and vitals reviewed.    ED Treatments / Results  Labs (all labs ordered are listed, but only abnormal results are displayed) Labs Reviewed   URINALYSIS, ROUTINE W REFLEX MICROSCOPIC - Abnormal; Notable for the following components:      Result Value   Leukocytes, UA SMALL (*)    Squamous Epithelial / LPF 0-5 (*)    All other components within normal limits  URINE CULTURE  RPR  HIV ANTIBODY (ROUTINE TESTING)  GC/CHLAMYDIA PROBE AMP ([place]) NOT AT [facility]    EKG  EKG Interpretation None       Radiology No results found.  Procedures Procedures (including critical care time)  Medications Ordered in ED Medications  cefTRIAXone (ROCEPHIN) injection 250 mg (250 mg Intramuscular Given 03/24/17 1537)  azithromycin (ZITHROMAX) tablet 1,000 mg (1,000 mg Oral Given 03/24/17 1537)  sterile water (preservative free) injection (10 mLs  Given 03/24/17 1537)     Initial Impression / Assessment and Plan / ED Course  I have reviewed the triage vital signs and the nursing notes.  Pertinent labs & imaging results that were available during my care of the patient were reviewed by me and considered in my medical decision making (see chart for details).      Final Clinical Impressions(s) / ED Diagnoses   Final diagnoses:  Penile discharge  Essential hypertension   Patient is nontoxic-appearing, afebrile, and in no acute distress.  Patient with high risk sexual activity.  Early urethritis secondary to gonorrhea or chlamydia.  No evidence of urinary tract infection.  No trichomonas picked up in urine.  Urine culture sent due to small leukocyte esterase, likely secondary to STI.  No evidence of epididymitis today.  No ulcer suspicious for syphilis.  Patient was also tested for HIV and syphilis today.  Empiric treatment provided with ceftriaxone and azithromycin.  I discussed with patient prevention of sexually transmitted infection and encouraged routine condom use.  Return precautions given for any rectal pain, testicular pain, lower abdominal pain, fever, chills.  Patient is in understanding and agrees with the plan of care.  Of  note, patient had an initial pulse of 43.  Patient is asymptomatic of this.  Patient does not know of a history of having bradycardia.  Patient is athletic.  No further workup warranted at this time for this pulse.  It was rechecked by me and found to be 62.  Blood pressure is again elevated this visit (138/83).  I discussed this with the patient.  This was noted to be elevated on the last visit.  I recommended PCP follow-up for further hypertensive management.  ED Discharge Orders    None       Delia ChimesMurray, Evea Sheek B, PA-C 03/24/17 1610    Arby BarrettePfeiffer, Marcy, MD 03/24/17 1733

## 2017-03-24 NOTE — Discharge Instructions (Signed)
We treated you for gonorrhea and chlamydia today.  Use a condom with every sexual encounter Follow up with the health department, your primary care provider, and infectious disease if needed.  I provided an infectious disease physicians information if you are to be positive for syphilis.  Please return to the ER for worsening symptoms, high fevers or persistent vomiting.  You have been tested for HIV, syphilis, chlamydia and gonorrhea. These results will be available in approximately 3 days. You will be notified if they are positive.   It is very important to practice safe sex and use condoms when sexually active. If your results are positive you need to notify all sexual partners so they can be treated as well. The website https://garcia.net/http://www.dontspreadit.com/ can be used to send anonymous text messages or emails to alert sexual contacts.   SEEK IMMEDIATE MEDICAL CARE IF:  You develop an oral temperature above 102 F (38.9 C), not controlled by medications or lasting more than 2 days.  You develop an increase in pain.  You developed testicular pain, rectal pain, lower belly pain, or nausea or vomiting that does not resolve. You develop painful intercourse.    Please also follow-up with your primary care provider regarding your high blood pressure.  I included some information in your discharge on lifestyle adjustments that you can make to help with high blood pressure.

## 2017-03-24 NOTE — ED Triage Notes (Signed)
Patient c/o white milky penile discharge x 2 days.

## 2017-03-25 LAB — HIV ANTIBODY (ROUTINE TESTING W REFLEX): HIV Screen 4th Generation wRfx: NONREACTIVE

## 2017-03-26 LAB — URINE CULTURE: CULTURE: NO GROWTH

## 2017-03-27 LAB — GC/CHLAMYDIA PROBE AMP (~~LOC~~) NOT AT ARMC
Chlamydia: NEGATIVE
Neisseria Gonorrhea: NEGATIVE

## 2017-03-27 LAB — RPR: RPR Ser Ql: REACTIVE — AB

## 2017-03-27 LAB — RPR, QUANT+TP ABS (REFLEX)
Rapid Plasma Reagin, Quant: 1:8 {titer} — ABNORMAL HIGH
T Pallidum Abs: POSITIVE — AB

## 2017-03-29 ENCOUNTER — Emergency Department (HOSPITAL_BASED_OUTPATIENT_CLINIC_OR_DEPARTMENT_OTHER)
Admission: EM | Admit: 2017-03-29 | Discharge: 2017-03-29 | Disposition: A | Discharge status: Home / Self Care | Payer: Self-pay | Attending: Emergency Medicine | Admitting: Emergency Medicine

## 2017-03-29 ENCOUNTER — Other Ambulatory Visit: Payer: Self-pay

## 2017-03-29 ENCOUNTER — Encounter (HOSPITAL_BASED_OUTPATIENT_CLINIC_OR_DEPARTMENT_OTHER): Payer: Self-pay | Admitting: *Deleted

## 2017-03-29 DIAGNOSIS — H538 Other visual disturbances: Secondary | ICD-10-CM | POA: Insufficient documentation

## 2017-03-29 DIAGNOSIS — H1033 Unspecified acute conjunctivitis, bilateral: Secondary | ICD-10-CM | POA: Insufficient documentation

## 2017-03-29 DIAGNOSIS — M25551 Pain in right hip: Secondary | ICD-10-CM | POA: Insufficient documentation

## 2017-03-29 DIAGNOSIS — Z87891 Personal history of nicotine dependence: Secondary | ICD-10-CM | POA: Insufficient documentation

## 2017-03-29 MED ORDER — FLUORESCEIN SODIUM 0.6 MG OP STRP
ORAL_STRIP | OPHTHALMIC | Status: AC
  Filled 2017-03-29: qty 1

## 2017-03-29 MED ORDER — OFLOXACIN 0.3 % OP SOLN
1.0000 [drp] | Freq: Four times a day (QID) | OPHTHALMIC | Status: DC
  Administered 2017-03-29: 1 [drp] via OPHTHALMIC
  Filled 2017-03-29: qty 5

## 2017-03-29 MED ORDER — TETRACAINE HCL 0.5 % OP SOLN
OPHTHALMIC | Status: AC
  Filled 2017-03-29: qty 4

## 2017-03-29 NOTE — Discharge Instructions (Signed)
Please read and follow all provided instructions.  Your diagnoses today include:  1. Acute conjunctivitis of both eyes, unspecified acute conjunctivitis type     Tests performed today include: Visual acuity testing to check your vision Fluorescein dye examination to look for scratches on your eye Tonometry to check the pressure inside of your eye Vital signs. See below for your results today.   Medications prescribed:   Take any prescribed medications only as directed.  Please place 1-2 drops in the eyes every 4 hours for the first 2 days while awake.  Subsequently, place 1-2 drops 4 times a day for an additional 3-5 days.  Home care instructions:  Follow any educational materials contained in this packet. If you wear contact lenses, do not use them until your eye caregiver approves. Follow-up care is necessary to be sure the infection is healing if not completely resolved in 2-3 days. See your caregiver or eye specialist as suggested for followup.   If you have an eye infection, wash your hands often as this is very contagious and is easily spread from person to person.   Follow-up instructions: Please follow-up with your primary care doctor OR the opthalmologist listed in the next 2-3 days for further evaluation of your symptoms.  Return instructions:  Please return to the Emergency Department if you experience worsening symptoms.  Please return immediately if you develop severe pain, pus drainage, new change in vision, or fever. Please return if you have any other emergent concerns.  Additional Information:  Your vital signs today were: BP (!) 134/96    Pulse 95    Temp 98.2 F (36.8 C) (Oral)    Resp 18    Ht 5\' 7"  (1.702 m)    Wt 115.7 kg (255 lb)    SpO2 99%    BMI 39.94 kg/m  If your blood pressure (BP) was elevated above 130/80 this visit, please have this repeated by your doctor within one month. ---------------

## 2017-03-29 NOTE — ED Triage Notes (Signed)
Pt c/o bil redness and drainage x 1 dya

## 2017-03-29 NOTE — ED Provider Notes (Signed)
[facility] EMERGENCY DEPARTMENT Provider Note   CSN: [id number] Arrival date & time: 03/29/17  1700     History   Chief Complaint Chief Complaint  Patient presents with  . Eye Drainage    HPI Chad Austin is a 27 y.o. male.  HPI   Patient is a 27 year old male with a history of STD and syphilis presenting for bilateral conjunctival erythema.  Patient reports that this occurred when he woke up this morning.  Patient reports that he slept at work and does not usually sleep on this couch.  Patient reports that both eyes were equally erythematous.  Patient reports watery discharge.  No purulence.  Patient reports that his eyes feel "scratchy" and his vision is a bit blurred.  No fever or chills.  No retro-orbital pain.  No history of autoimmune diseases or immunocompromise status.  No recent sick contacts.  Patient has been congested recently.  Patient denies dysuria.  Patient reports he has had some right hip pain with movement, but no other joint pain.  Patient is not a contact lens wearer.  Past Medical History:  Diagnosis Date  . STD (sexually transmitted disease)   . Syphilis     Patient Active Problem List   Diagnosis Date Noted  . Transient elevated blood pressure 01/23/2017  . History of syphilis 01/23/2017  . Tobacco dependence due to cigarettes 01/23/2017  . High risk homosexual behavior 01/23/2017    History reviewed. No pertinent surgical history.     Home Medications    Prior to Admission medications   Not on File    Family History Family History  Problem Relation Age of Onset  . Hypertension Other   . Diabetes Other     Social History Social History   Tobacco Use  . Smoking status: Former Smoker    Packs/day: 0.50    Types: Cigarettes  . Smokeless tobacco: Never Used  Substance Use Topics  . Alcohol use: Yes    Comment: occasional  . Drug use: No     Allergies   Patient has no known allergies.   Review of Systems Review  of Systems  Constitutional: Negative for chills and fever.  HENT: Positive for congestion.   Eyes: Positive for pain, discharge, redness and visual disturbance. Negative for photophobia.  Gastrointestinal: Negative for nausea and vomiting.  Genitourinary: Negative for dysuria.  Musculoskeletal: Negative for joint swelling and myalgias.     Physical Exam Updated Vital Signs BP (!) 134/96   Pulse 95   Temp 98.2 F (36.8 C) (Oral)   Resp 18   Ht 5\' 7"  (1.702 m)   Wt 115.7 kg (255 lb)   SpO2 99%   BMI 39.94 kg/m   Physical Exam  Constitutional: He appears well-developed and well-nourished. No distress.  Sitting comfortably in bed.  HENT:  Head: Normocephalic and atraumatic.  Eyes: Right eye exhibits discharge. Left eye exhibits discharge.  Bilateral Eye Exam: No periorbital edema or erythema. Diffuse scleral injection bilaterally. Conjunctival erythema w/ mild chemosis. No foreign bodies identified on lid eversion. PERRL. EOMI and no pain with extraocular movements. Wood's lamp examination demonstrates no corneal uptake..    Neck: Normal range of motion.  Cardiovascular: Normal rate and regular rhythm.  Intact, 2+ radial pulse.  Pulmonary/Chest:  Normal respiratory effort. Patient converses comfortably. No audible wheeze or stridor.  Abdominal: He exhibits no distension.  Musculoskeletal: Normal range of motion.  Neurological: He is alert.  Cranial nerves intact to gross observation. Patient moves  extremities without difficulty.  Skin: Skin is warm and dry. He is not diaphoretic.  Psychiatric: He has a normal mood and affect. His behavior is normal. Judgment and thought content normal.  Nursing note and vitals reviewed.    Visual Acuity  Right Eye Distance: 20/20(Uncorrected) Left Eye Distance: 20/25(Uncorrected) Bilateral Distance: 20/15(Uncorrected)  Right Eye Near:   Left Eye Near:    Bilateral Near:      ED Treatments / Results  Labs (all labs ordered are  listed, but only abnormal results are displayed) Labs Reviewed - No data to display  EKG  EKG Interpretation None       Radiology No results found.  Procedures Procedures (including critical care time)  Medications Ordered in ED Medications  fluorescein 0.6 MG ophthalmic strip (not administered)  tetracaine (PONTOCAINE) 0.5 % ophthalmic solution (not administered)  ofloxacin (OCUFLOX) 0.3 % ophthalmic solution 1 drop (not administered)     Initial Impression / Assessment and Plan / ED Course  I have reviewed the triage vital signs and the nursing notes.  Pertinent labs & imaging results that were available during my care of the patient were reviewed by me and considered in my medical decision making (see chart for details).      Final Clinical Impressions(s) / ED Diagnoses   Final diagnoses:  Acute conjunctivitis of both eyes, unspecified acute conjunctivitis type   Patient is nontoxic-appearing, afebrile, in no acute distress.  Patient has bilateral conjunctival and scleral erythema.  There is no purulent drainage.  Visual acuity is normal.  Given the bilateral nature, as well as the lack of uptake on the cornea and lack of concerning features for acute angle-closure glaucoma, will treat empirically for conjunctivitis.  I provided ophthalmology follow-up.  Patient is in the process of obtaining a primary care provider through [facility].  I gave return precautions for any fevers, chills, worsening vision, worsening eye pain, or purulent drainage.  Coverage with antibiotics and ophthalmology follow-up.  Of note, patient was recently seen for penile drainage.  Patient reports this is since resolved.  Patient has no dysuria or joint pain.  I do not suspect Reiter's syndrome as a cause of the right eye.  The patient did have a reactive RPR, and he has a history of syphilis.  It was a 1-8 reactivity.  Patient has not received a call.  This was discussed with  Dr. Sherlie Ban.  I discussed that patient will likely receive a call if this reactivity is concerning for reinfection.  I do not feel it is suspicious for infection at this time.  This is a shared visit with Dr. Pricilla Loveless. Patient was independently evaluated by this attending physician. Attending physician consulted in evaluation and discharge management.  ED Discharge Orders    None       Delia Chimes 03/29/17 2153    Pricilla Loveless, MD 03/30/17 [phone]

## 2017-03-29 NOTE — ED Notes (Signed)
Pt to desk asking about wait time. Pt made aware PA was signed up for him and would be there as soon as possible. apologized for wait.

## 2017-05-10 ENCOUNTER — Emergency Department (HOSPITAL_BASED_OUTPATIENT_CLINIC_OR_DEPARTMENT_OTHER)
Admission: EM | Admit: 2017-05-10 | Discharge: 2017-05-10 | Disposition: A | Discharge status: Home / Self Care | Payer: Self-pay | Attending: Emergency Medicine | Admitting: Emergency Medicine

## 2017-05-10 ENCOUNTER — Other Ambulatory Visit: Payer: Self-pay

## 2017-05-10 ENCOUNTER — Encounter (HOSPITAL_BASED_OUTPATIENT_CLINIC_OR_DEPARTMENT_OTHER): Payer: Self-pay | Admitting: Emergency Medicine

## 2017-05-10 DIAGNOSIS — Z202 Contact with and (suspected) exposure to infections with a predominantly sexual mode of transmission: Secondary | ICD-10-CM | POA: Insufficient documentation

## 2017-05-10 DIAGNOSIS — Z711 Person with feared health complaint in whom no diagnosis is made: Secondary | ICD-10-CM

## 2017-05-10 DIAGNOSIS — Z87891 Personal history of nicotine dependence: Secondary | ICD-10-CM | POA: Insufficient documentation

## 2017-05-10 MED ORDER — AZITHROMYCIN 250 MG PO TABS
1000.0000 mg | ORAL_TABLET | Freq: Once | ORAL | Status: AC
  Administered 2017-05-10: 1000 mg via ORAL
  Filled 2017-05-10: qty 4

## 2017-05-10 MED ORDER — CEFTRIAXONE SODIUM 250 MG IJ SOLR
250.0000 mg | Freq: Once | INTRAMUSCULAR | Status: AC
  Administered 2017-05-10: 250 mg via INTRAMUSCULAR
  Filled 2017-05-10: qty 250

## 2017-05-10 MED ORDER — METRONIDAZOLE 500 MG PO TABS
2000.0000 mg | ORAL_TABLET | Freq: Once | ORAL | Status: AC
  Administered 2017-05-10: 2000 mg via ORAL
  Filled 2017-05-10: qty 4

## 2017-05-10 NOTE — ED Provider Notes (Signed)
[facility] EMERGENCY DEPARTMENT Provider Note   CSN: [id number] Arrival date & time: 05/10/17  [id number]     History   Chief Complaint Chief Complaint  Patient presents with  . Exposure to STD    HPI Chad Austin is a 27 y.o. male.  HPI   27 year old male with history of recurrent STDs and syphilis here with penile discharge.  The patient reportedly had sex with a girl over the weekend.  He then began to develop penile discharge 2 days later.  He reports that he has had persistent penile discharge that is yellow and white since then.  Said mild dysuria.  Denies any testicular pain.  No fevers or chills.  No recent night sweats or weight loss.  No other skin lesions.  Denies any other forms of intercourse.  Denies any sore throat or oral trauma.  No other medical complaints.  Of note, patient has a history of syphilis with positive RPR in the past.  He states he has been treated for this.  Past Medical History:  Diagnosis Date  . STD (sexually transmitted disease)   . Syphilis     Patient Active Problem List   Diagnosis Date Noted  . Transient elevated blood pressure 01/23/2017  . History of syphilis 01/23/2017  . Tobacco dependence due to cigarettes 01/23/2017  . High risk homosexual behavior 01/23/2017    History reviewed. No pertinent surgical history.     Home Medications    Prior to Admission medications   Not on File    Family History Family History  Problem Relation Age of Onset  . Hypertension Other   . Diabetes Other     Social History Social History   Tobacco Use  . Smoking status: Former Smoker    Packs/day: 0.50    Types: Cigarettes  . Smokeless tobacco: Never Used  Substance Use Topics  . Alcohol use: Yes    Comment: occasional  . Drug use: No     Allergies   Patient has no known allergies.   Review of Systems Review of Systems  Constitutional: Negative for chills and fever.  Respiratory: Negative for shortness of  breath.   Cardiovascular: Negative for chest pain.  Genitourinary: Positive for discharge and penile pain.  Musculoskeletal: Negative for neck pain.  Skin: Negative for rash and wound.  Allergic/Immunologic: Negative for immunocompromised state.  Neurological: Negative for weakness and numbness.  Hematological: Does not bruise/bleed easily.     Physical Exam Updated Vital Signs BP (!) 141/90 (BP Location: Left Arm)   Pulse (!) 18   Temp 98.2 F (36.8 C) (Oral)   Resp 18   Ht 5\' 7"  (1.702 m)   Wt 122.5 kg (270 lb)   SpO2 100%   BMI 42.29 kg/m   Physical Exam  Constitutional: He is oriented to person, place, and time. He appears well-developed and well-nourished. No distress.  HENT:  Head: Normocephalic and atraumatic.  Eyes: Conjunctivae are normal.  Neck: Neck supple.  Cardiovascular: Normal rate, regular rhythm and normal heart sounds.  Pulmonary/Chest: Effort normal. No respiratory distress. He has no wheezes.  Abdominal: He exhibits no distension.  Genitourinary:  Genitourinary Comments: Moderate amount of expressible purulent penile discharge.  No penile lesions.  No testicular or scrotal tenderness or pain.  Testes descended bilaterally.  Musculoskeletal: He exhibits no edema.  Neurological: He is alert and oriented to person, place, and time. He exhibits normal muscle tone.  Skin: Skin is warm. Capillary refill takes less than  2 seconds. No rash noted.  Nursing note and vitals reviewed.    ED Treatments / Results  Labs (all labs ordered are listed, but only abnormal results are displayed) Labs Reviewed  RPR  HIV ANTIBODY (ROUTINE TESTING)  FLUORESCENT TREPONEMAL AB(FTA)-IGG-BLD  GC/CHLAMYDIA PROBE AMP ([place]) NOT AT [facility]    EKG  EKG Interpretation None       Radiology No results found.  Procedures Procedures (including critical care time)  Medications Ordered in ED Medications  cefTRIAXone (ROCEPHIN) injection 250 mg (not administered)    metroNIDAZOLE (FLAGYL) tablet 2,000 mg (not administered)  azithromycin (ZITHROMAX) tablet 1,000 mg (not administered)     Initial Impression / Assessment and Plan / ED Course  I have reviewed the triage vital signs and the nursing notes.  Pertinent labs & imaging results that were available during my care of the patient were reviewed by me and considered in my medical decision making (see chart for details).     27 year old male with history of high risk sexual activity here with concern for recurrent STD.  Patient treated empirically.  He has no evidence of syphilis lesions, but does have a history of this and will test with titers given his positive RPR in the past.  Encouraged safe sex practice.  Final Clinical Impressions(s) / ED Diagnoses   Final diagnoses:  Concern about STD in male without diagnosis    ED Discharge Orders    None       Shaune Pollack, MD 05/10/17 1029

## 2017-05-10 NOTE — ED Triage Notes (Signed)
Patient states that he had unprotected sex on Sunday - since Monday he has had noted d/c

## 2017-05-10 NOTE — Discharge Instructions (Signed)

## 2017-05-11 LAB — GC/CHLAMYDIA PROBE AMP (~~LOC~~) NOT AT ARMC
Chlamydia: NEGATIVE
NEISSERIA GONORRHEA: POSITIVE — AB

## 2017-05-11 LAB — HIV ANTIBODY (ROUTINE TESTING W REFLEX): HIV Screen 4th Generation wRfx: NONREACTIVE

## 2017-05-11 LAB — RPR, QUANT+TP ABS (REFLEX): TREPONEMA PALLIDUM AB: POSITIVE — AB

## 2017-05-11 LAB — RPR: RPR: REACTIVE — AB

## 2017-07-07 ENCOUNTER — Emergency Department (HOSPITAL_COMMUNITY)
Admission: EM | Admit: 2017-07-07 | Discharge: 2017-07-07 | Disposition: A | Discharge status: Home / Self Care | Payer: Self-pay | Attending: Emergency Medicine | Admitting: Emergency Medicine

## 2017-07-07 ENCOUNTER — Encounter (HOSPITAL_COMMUNITY): Payer: Self-pay | Admitting: Emergency Medicine

## 2017-07-07 DIAGNOSIS — Z87891 Personal history of nicotine dependence: Secondary | ICD-10-CM | POA: Insufficient documentation

## 2017-07-07 DIAGNOSIS — R369 Urethral discharge, unspecified: Secondary | ICD-10-CM | POA: Insufficient documentation

## 2017-07-07 DIAGNOSIS — A64 Unspecified sexually transmitted disease: Secondary | ICD-10-CM

## 2017-07-07 MED ORDER — METRONIDAZOLE 500 MG PO TABS
2000.0000 mg | ORAL_TABLET | Freq: Once | ORAL | Status: AC
Start: 2017-07-07 — End: 2017-07-07
  Administered 2017-07-07: 2000 mg via ORAL
  Filled 2017-07-07: qty 4

## 2017-07-07 MED ORDER — STERILE WATER FOR INJECTION IJ SOLN
INTRAMUSCULAR | Status: AC
  Administered 2017-07-07: 10 mL
  Filled 2017-07-07: qty 10

## 2017-07-07 MED ORDER — CEFTRIAXONE SODIUM 250 MG IJ SOLR
250.0000 mg | Freq: Once | INTRAMUSCULAR | Status: AC
  Administered 2017-07-07: 250 mg via INTRAMUSCULAR
  Filled 2017-07-07: qty 250

## 2017-07-07 MED ORDER — AZITHROMYCIN 250 MG PO TABS
1000.0000 mg | ORAL_TABLET | Freq: Once | ORAL | Status: AC
  Administered 2017-07-07: 1000 mg via ORAL
  Filled 2017-07-07: qty 4

## 2017-07-07 NOTE — ED Provider Notes (Signed)
[facility]   CSN: [id number] Arrival date & time: 07/07/17  1503     History   Chief Complaint Chief Complaint  Patient presents with  . Penile Discharge    HPI Chad Austin is a 27 y.o. male.  HPI  Chad Austin is a 27 y.o. male presents to emergency department with complaint of penile discharge.  Patient reports white discharge for the last 2 days, this morning states it was in his underwear.  He reports dysuria.  No abdominal pain.  No nausea or vomiting.  No fever.  No skin lesions or sores.  No pain in his scrotum.  History of unprotected intercourse.  History of prior STDs.   Past Medical History:  Diagnosis Date  . STD (sexually transmitted disease)   . Syphilis     Patient Active Problem List   Diagnosis Date Noted  . Transient elevated blood pressure 01/23/2017  . History of syphilis 01/23/2017  . Tobacco dependence due to cigarettes 01/23/2017  . High risk homosexual behavior 01/23/2017    History reviewed. No pertinent surgical history.      Home Medications    Prior to Admission medications   Not on File    Family History Family History  Problem Relation Age of Onset  . Hypertension Other   . Diabetes Other     Social History Social History   Tobacco Use  . Smoking status: Former Smoker    Packs/day: 0.50    Types: Cigarettes  . Smokeless tobacco: Never Used  Substance Use Topics  . Alcohol use: Yes    Comment: occasional  . Drug use: No     Allergies   Patient has no known allergies.   Review of Systems Review of Systems  Constitutional: Negative for chills and fever.  Respiratory: Negative for cough, chest tightness and shortness of breath.   Cardiovascular: Negative for chest pain, palpitations and leg swelling.  Gastrointestinal: Negative for abdominal pain, diarrhea, nausea and vomiting.  Genitourinary: Positive for discharge. Negative for dysuria, frequency,  hematuria, penile pain, penile swelling, scrotal swelling, testicular pain and urgency.  Musculoskeletal: Negative for arthralgias, myalgias, neck pain and neck stiffness.  Skin: Negative for rash.  Allergic/Immunologic: Negative for immunocompromised state.  Neurological: Negative for weakness and headaches.  All other systems reviewed and are negative.    Physical Exam Updated Vital Signs BP 137/84 (BP Location: Right Arm)   Pulse 91   Temp 98.3 F (36.8 C) (Oral)   Resp 17   Wt 113.4 kg (250 lb)   SpO2 100%   BMI 39.16 kg/m   Physical Exam  Constitutional: He appears well-developed and well-nourished. No distress.  Eyes: Conjunctivae are normal.  Neck: Neck supple.  Cardiovascular: Normal rate.  Pulmonary/Chest: No respiratory distress.  Abdominal: He exhibits no distension.  Genitourinary:  Genitourinary Comments: Normal external genitalia, there is white/milky discharge at meatus.  No rashes  Skin: Skin is warm and dry.  Nursing note and vitals reviewed.    ED Treatments / Results  Labs (all labs ordered are listed, but only abnormal results are displayed) Labs Reviewed  HIV ANTIBODY (ROUTINE TESTING)  RPR  GC/CHLAMYDIA PROBE AMP ([place]    EKG None  Radiology No results found.  Procedures Procedures (including critical care time)  Medications Ordered in ED Medications  cefTRIAXone (ROCEPHIN) injection 250 mg (has no administration in time range)  azithromycin (ZITHROMAX) tablet 1,000 mg (has no administration in time range)  metroNIDAZOLE (FLAGYL) tablet 2,000 mg (has no administration in time range)     Initial Impression / Assessment and Plan / ED Course  I have reviewed the triage vital signs and the nursing notes.  Pertinent labs & imaging results that were available during my care of the patient were reviewed by me and considered in my medical decision making (see chart for details).     Patient with penile discharge,  concerning for STI.  History of unprotected intercourse.  Discussed always using protection for STD prevention.  Patient with multiple visits for prior STDs.  Discussed obtaining cultures, including HIV and RPR today, patient is certainly under high risk of these infections as well.  Discussed no intercourse for a week.  Will treat with Zithromax, Rocephin, Flagyl today.  Follow-up with health department.  Vitals:   07/07/17 1535 07/07/17 1536  BP: 137/84   Pulse: 91   Resp: 17   Temp: 98.3 F (36.8 C)   TempSrc: Oral   SpO2: 100%   Weight:  113.4 kg (250 lb)     Final Clinical Impressions(s) / ED Diagnoses   Final diagnoses:  Penile discharge  STD (male)    ED Discharge Orders    None       Jaynie Crumble, PA-C 07/07/17 1627    Linwood Dibbles, MD 07/08/17 [phone]

## 2017-07-07 NOTE — ED Triage Notes (Signed)
Pt reports has white penile discharge for past couple days and discomfort in penis

## 2017-07-07 NOTE — Discharge Instructions (Addendum)
Always use protection when having intercourse.  You were treated today for possible sexually transmitted infection, no intercourse for 1 week.  If your cultures come back abnormal you will be contacted.

## 2017-07-08 LAB — HIV ANTIBODY (ROUTINE TESTING W REFLEX): HIV Screen 4th Generation wRfx: NONREACTIVE

## 2017-07-09 LAB — RPR, QUANT+TP ABS (REFLEX): TREPONEMA PALLIDUM AB: POSITIVE — AB

## 2017-07-09 LAB — RPR: RPR Ser Ql: REACTIVE — AB

## 2017-07-10 LAB — GC/CHLAMYDIA PROBE AMP (~~LOC~~) NOT AT ARMC
CHLAMYDIA, DNA PROBE: NEGATIVE
Neisseria Gonorrhea: POSITIVE — AB

## 2017-11-13 ENCOUNTER — Emergency Department (HOSPITAL_COMMUNITY)
Admission: EM | Admit: 2017-11-13 | Discharge: 2017-11-13 | Disposition: A | Discharge status: Home / Self Care | Payer: 59 | Attending: Emergency Medicine | Admitting: Emergency Medicine

## 2017-11-13 ENCOUNTER — Encounter (HOSPITAL_COMMUNITY): Payer: Self-pay

## 2017-11-13 ENCOUNTER — Other Ambulatory Visit: Payer: Self-pay

## 2017-11-13 DIAGNOSIS — R369 Urethral discharge, unspecified: Secondary | ICD-10-CM

## 2017-11-13 DIAGNOSIS — Z87891 Personal history of nicotine dependence: Secondary | ICD-10-CM | POA: Diagnosis not present

## 2017-11-13 DIAGNOSIS — Z202 Contact with and (suspected) exposure to infections with a predominantly sexual mode of transmission: Secondary | ICD-10-CM | POA: Insufficient documentation

## 2017-11-13 DIAGNOSIS — Z7251 High risk heterosexual behavior: Secondary | ICD-10-CM | POA: Diagnosis not present

## 2017-11-13 DIAGNOSIS — A5401 Gonococcal cystitis and urethritis, unspecified: Secondary | ICD-10-CM | POA: Insufficient documentation

## 2017-11-13 LAB — GC/CHLAMYDIA PROBE AMP (~~LOC~~) NOT AT ARMC
Chlamydia: NEGATIVE
NEISSERIA GONORRHEA: POSITIVE — AB

## 2017-11-13 MED ORDER — AZITHROMYCIN 250 MG PO TABS
1000.0000 mg | ORAL_TABLET | Freq: Once | ORAL | Status: AC
  Administered 2017-11-13: 1000 mg via ORAL
  Filled 2017-11-13: qty 4

## 2017-11-13 MED ORDER — CEFTRIAXONE SODIUM 250 MG IJ SOLR
250.0000 mg | Freq: Once | INTRAMUSCULAR | Status: AC
  Administered 2017-11-13: 250 mg via INTRAMUSCULAR
  Filled 2017-11-13: qty 250

## 2017-11-13 MED ORDER — LIDOCAINE HCL (PF) 1 % IJ SOLN
2.0000 mL | Freq: Once | INTRAMUSCULAR | Status: AC
  Administered 2017-11-13: 2 mL
  Filled 2017-11-13: qty 30

## 2017-11-13 MED ORDER — DOXYCYCLINE HYCLATE 100 MG PO CAPS: 100.0000 mg | ORAL_CAPSULE | Freq: Two times a day (BID) | ORAL | 0 refills | Status: AC

## 2017-11-13 NOTE — ED Provider Notes (Signed)
[facility] Provider Note   CSN: [id number] Arrival date & time: 11/13/17  0126     History   Chief Complaint Chief Complaint  Patient presents with  . Penile Discharge    HPI Chad Austin is a 27 y.o. male.  Patient presents to ED with complaint of penile discharge for the past 1 day. No dysuria, fever, abdominal pain, testicular pain or scrotal swelling.   The history is provided by the patient.  Penile Discharge     Past Medical History:  Diagnosis Date  . STD (sexually transmitted disease)   . Syphilis     Patient Active Problem List   Diagnosis Date Noted  . Transient elevated blood pressure 01/23/2017  . History of syphilis 01/23/2017  . Tobacco dependence due to cigarettes 01/23/2017  . High risk homosexual behavior 01/23/2017    History reviewed. No pertinent surgical history.      Home Medications    Prior to Admission medications   Not on File    Family History Family History  Problem Relation Age of Onset  . Hypertension Other   . Diabetes Other     Social History Social History   Tobacco Use  . Smoking status: Former Smoker    Packs/day: 0.50    Types: Cigarettes  . Smokeless tobacco: Never Used  Substance Use Topics  . Alcohol use: Yes    Comment: occasional  . Drug use: No     Allergies   Patient has no known allergies.   Review of Systems Review of Systems  Constitutional: Negative for chills and fever.  Gastrointestinal: Negative.   Genitourinary: Positive for discharge.  Musculoskeletal: Negative.   Skin: Negative.   Neurological: Negative.      Physical Exam Updated Vital Signs BP (!) 148/96 (BP Location: Left Arm)   Pulse 79   Temp 98.3 F (36.8 C) (Oral)   Resp 16   SpO2 98%   Physical Exam  Constitutional: He is oriented to person, place, and time. He appears well-developed and well-nourished.  Neck: Normal range of motion.  Pulmonary/Chest: Effort normal.    Abdominal: There is no tenderness.  Genitourinary:  Genitourinary Comments: Circumcised penis with purulent penile discharge present. No scrotal swelling or testicular tenderness.   Musculoskeletal: Normal range of motion.  Neurological: He is alert and oriented to person, place, and time.  Skin: Skin is warm and dry.  Psychiatric: He has a normal mood and affect.     ED Treatments / Results  Labs (all labs ordered are listed, but only abnormal results are displayed) Labs Reviewed - No data to display  EKG None  Radiology No results found.  Procedures Procedures (including critical care time)  Medications Ordered in ED Medications - No data to display   Initial Impression / Assessment and Plan / ED Course  I have reviewed the triage vital signs and the nursing notes.  Pertinent labs & imaging results that were available during my care of the patient were reviewed by me and considered in my medical decision making (see chart for details).     Here for penile discharge.   Chart review shows this to be his 7th visit for STD concerns in the past 12 months. He has been positive for syphilis in the past. As he should be considered high risk sexual behavior, he will be screened for HIV and RPR, GC/chlamydia.   Zithromax and Rocephin provided. Home with 7 day course Doxy. Strongly encouraged use of  condoms.     Final Clinical Impressions(s) / ED Diagnoses   Final diagnoses:  None   1. STD 2. Urethritis 3. High risk sexual behavior  ED Discharge Orders    None       Elpidio Anis, PA-C 11/13/17 0334    Devoria Albe, MD 11/13/17 [phone]

## 2017-11-13 NOTE — ED Triage Notes (Signed)
Pt reports penile discharge x 1.5 days. He reports that the discharge is "green and milky." Endorses recent unprotected sex. Endorses pain with urination. Denies N/V/F.

## 2017-11-14 LAB — RPR, QUANT+TP ABS (REFLEX)
Rapid Plasma Reagin, Quant: 1:8 {titer} — ABNORMAL HIGH
TREPONEMA PALLIDUM AB: POSITIVE — AB

## 2017-11-14 LAB — RPR: RPR: REACTIVE — AB

## 2017-11-14 LAB — HIV ANTIBODY (ROUTINE TESTING W REFLEX): HIV SCREEN 4TH GENERATION: NONREACTIVE

## 2017-11-22 ENCOUNTER — Ambulatory Visit: Payer: 59

## 2017-11-22 NOTE — Progress Notes (Signed)
Patient ID: Chad Austin, male   DOB: 12/16/1990, 27 y.o.   MRN: [id number]       Chad Austin, is a 27 y.o. male  [id number]  [id number]  DOB - 1990-12-16  Subjective:  Chief Complaint and HPI: Chad Austin is a 27 y.o. male here today for a follow up visit after being  Seen in ED 11/13/2017 and tested + for gonorrhea and still + titer for syhpilis after treatment.  Health department was notified.  This ED visit, he was treated with azithromycin, rocephin, and Doxycycline.  No symptoms now.    BP has been elevated on and off.  Sometimes high at home and sometimes high normal.  (also consistent with Epic readings)  H/o hyperglycemia in high school.   ED/Hospital notes reviewed and summarized above.   Social History:  Has high risk unprotected sex.  Has SSPs.  ROS:   Constitutional:  No f/c, No night sweats, No unexplained weight loss. EENT:  No vision changes, No blurry vision, No hearing changes. No mouth, throat, or ear problems.  Respiratory: No cough, No SOB Cardiac: No CP, no palpitations GI:  No abd pain, No N/V/D. GU: No Urinary s/sx Musculoskeletal: No joint pain Neuro: No headache, no dizziness, no motor weakness.  Skin: No rash Endocrine:  No polydipsia. No polyuria.  Psych: Denies SI/HI  No problems updated.  ALLERGIES: No Known Allergies  PAST MEDICAL HISTORY: Past Medical History:  Diagnosis Date  . STD (sexually transmitted disease)   . Syphilis     MEDICATIONS AT HOME: Prior to Admission medications   Not on File     Objective:  EXAM:   Vitals:   11/23/17 1021  BP: 133/85  Pulse: 84  Resp: 18  Temp: 98.2 F (36.8 C)  TempSrc: Oral  SpO2: 99%  Weight: 265 lb (120.2 kg)  Height: 5\' 7"  (1.702 m)    General appearance : A&OX3. NAD. Non-toxic-appearing HEENT: Atraumatic and Normocephalic.  PERRLA. EOM intact.   Neck: supple, no JVD. No cervical lymphadenopathy. No thyromegaly Chest/Lungs:  Breathing-non-labored, Good air  entry bilaterally, breath sounds normal without rales, rhonchi, or wheezing  CVS: S1 S2 regular, no murmurs, gallops, rubs  Extremities: Bilateral Lower Ext shows no edema, both legs are warm to touch with = pulse throughout Neurology:  CN II-XII grossly intact, Non focal.   Psych:  TP linear. J/I WNL. Normal speech. Appropriate eye contact and affect.  Skin:  No Rash  Data Review No results found for: HGBA1C   Assessment & Plan   1. Elevated BP without diagnosis of hypertension Check BP and pulse daily(he has cuff at home)-record and bring to next visit.  Eliminate sugar and salt from diet, lose weight, exercise.  Consider BP med if needed after 2 months if unsuccessful with lifestyle changes - Comprehensive metabolic panel  2. Encounter for examination following treatment at hospital No symptoms now  3. History of syphilis Advised condoms, safe sex practices always  4. High risk sexual behavior, unspecified type Advised condoms, safe sex practices always  5. Hyperglycemia in high school I have had a lengthy discussion and provided education about insulin resistance and the intake of too much sugar/refined carbohydrates.  I have advised the patient to work at a goal of eliminating sugary drinks, candy, desserts, sweets, refined sugars, processed foods, and white carbohydrates.  The patient expresses understanding.    Patient have been counseled extensively about nutrition and exercise  Return in about 2 months (around 01/23/2018) for Zelda-recheck  BP.  The patient was given clear instructions to go to ER or return to medical center if symptoms don't improve, worsen or new problems develop. The patient verbalized understanding. The patient was told to call to get lab results if they haven't heard anything in the next week.     Georgian Co, PA-C [facility] [place], [place] [phone]   11/23/2017, 10:38  AM

## 2017-11-23 ENCOUNTER — Ambulatory Visit: Payer: 59 | Attending: Nurse Practitioner | Admitting: Physician Assistant

## 2017-11-23 VITALS — BP 133/85 | HR 84 | Temp 98.2°F | Resp 18 | Ht 67.0 in | Wt 265.0 lb

## 2017-11-23 DIAGNOSIS — Z09 Encounter for follow-up examination after completed treatment for conditions other than malignant neoplasm: Secondary | ICD-10-CM

## 2017-11-23 DIAGNOSIS — Z7251 High risk heterosexual behavior: Secondary | ICD-10-CM | POA: Diagnosis not present

## 2017-11-23 DIAGNOSIS — Z8619 Personal history of other infectious and parasitic diseases: Secondary | ICD-10-CM | POA: Insufficient documentation

## 2017-11-23 DIAGNOSIS — R030 Elevated blood-pressure reading, without diagnosis of hypertension: Secondary | ICD-10-CM | POA: Insufficient documentation

## 2017-11-23 DIAGNOSIS — R739 Hyperglycemia, unspecified: Secondary | ICD-10-CM | POA: Insufficient documentation

## 2017-11-23 NOTE — Patient Instructions (Signed)
Eliminate sugar, white carbohydrates, and salt from your diet.  Limit alcohol intake.  Check blood pressure/pulse daily and record and bring to next visit.      Hyperglycemia Hyperglycemia is when the sugar (glucose) level in your blood is too high. It may not cause symptoms. If you do have symptoms, they may include warning signs, such as:  Feeling more thirsty than normal.  Hunger.  Feeling tired.  Needing to pee (urinate) more than normal.  Blurry eyesight (vision).  You may get other symptoms as it gets worse, such as:  Dry mouth.  Not being hungry (loss of appetite).  Fruity-smelling breath.  Weakness.  Weight gain or loss that is not planned. Weight loss may be fast.  A tingling or numb feeling in your hands or feet.  Headache.  Skin that does not bounce back quickly when it is lightly pinched and released (poor skin turgor).  Pain in your belly (abdomen).  Cuts or bruises that heal slowly.  High blood sugar can happen to people who do or do not have diabetes. High blood sugar can happen slowly or quickly, and it can be an emergency. Follow these instructions at home: General instructions  Take over-the-counter and prescription medicines only as told by your doctor.  Do not use products that contain nicotine or tobacco, such as cigarettes and e-cigarettes. If you need help quitting, ask your doctor.  Limit alcohol intake to no more than 1 drink per day for nonpregnant women and 2 drinks per day for men. One drink equals 12 oz of beer, 5 oz of wine, or 1 oz of hard liquor.  Manage stress. If you need help with this, ask your doctor.  Keep all follow-up visits as told by your doctor. This is important. Eating and drinking  Stay at a healthy weight.  Exercise regularly, as told by your doctor.  Drink enough fluid, especially when you: ? Exercise. ? Get sick. ? Are in hot temperatures.  Eat healthy foods, such as: ? Low-fat (lean)  proteins. ? Complex carbs (complex carbohydrates), such as whole wheat bread or brown rice. ? Fresh fruits and vegetables. ? Low-fat dairy products. ? Healthy fats.  Drink enough fluid to keep your pee (urine) clear or pale yellow. If you have diabetes:  Make sure you know the symptoms of hyperglycemia.  Follow your diabetes management plan, as told by your doctor. Make sure you: ? Take insulin and medicines as told. ? Follow your exercise plan. ? Follow your meal plan. Eat on time. Do not skip meals. ? Check your blood sugar as often as told. Make sure to check before and after exercise. If you exercise longer or in a different way than you normally do, check your blood sugar more often. ? Follow your sick day plan whenever you cannot eat or drink normally. Make this plan ahead of time with your doctor.  Share your diabetes management plan with people in your workplace, school, and household.  Check your urine for ketones when you are ill and as told by your doctor.  Carry a card or wear jewelry that says that you have diabetes. Contact a doctor if:  Your blood sugar level is higher than 240 mg/dL (29.5 mmol/L) for 2 days in a row.  You have problems keeping your blood sugar in your target range.  High blood sugar happens often for you. Get help right away if:  You have trouble breathing.  You have a change in how you think,  feel, or act (mental status).  You feel sick to your stomach (nauseous), and that feeling does not go away.  You cannot stop throwing up (vomiting). These symptoms may be an emergency. Do not wait to see if the symptoms will go away. Get medical help right away. Call your local emergency services (911 in the [place] Do not drive yourself to the hospital. Summary  Hyperglycemia is when the sugar (glucose) level in your blood is too high.  High blood sugar can happen to people who do or do not have diabetes.  Make sure you drink enough fluids, eat  healthy foods, and exercise regularly.  Contact your doctor if you have problems keeping your blood sugar in your target range. This information is not intended to replace advice given to you by your health care provider. Make sure you discuss any questions you have with your health care provider. Document Released: 01/02/2009 Document Revised: 11/23/2015 Document Reviewed: 11/23/2015 Elsevier Interactive Patient Education  2017 [employer]  Hypertension Hypertension, commonly called high blood pressure, is when the force of blood pumping through the arteries is too strong. The arteries are the blood vessels that carry blood from the heart throughout the body. Hypertension forces the heart to work harder to pump blood and may cause arteries to become narrow or stiff. Having untreated or uncontrolled hypertension can cause heart attacks, strokes, kidney disease, and other problems. A blood pressure reading consists of a higher number over a lower number. Ideally, your blood pressure should be below 120/80. The first ("top") number is called the systolic pressure. It is a measure of the pressure in your arteries as your heart beats. The second ("bottom") number is called the diastolic pressure. It is a measure of the pressure in your arteries as the heart relaxes. What are the causes? The cause of this condition is not known. What increases the risk? Some risk factors for high blood pressure are under your control. Others are not. Factors you can change  Smoking.  Having type 2 diabetes mellitus, high cholesterol, or both.  Not getting enough exercise or physical activity.  Being overweight.  Having too much fat, sugar, calories, or salt (sodium) in your diet.  Drinking too much alcohol. Factors that are difficult or impossible to change  Having chronic kidney disease.  Having a family history of high blood pressure.  Age. Risk increases with age.  Race. You may be at higher risk  if you are African-American.  Gender. Men are at higher risk than women before age 74. After age 65, women are at higher risk than men.  Having obstructive sleep apnea.  Stress. What are the signs or symptoms? Extremely high blood pressure (hypertensive crisis) may cause:  Headache.  Anxiety.  Shortness of breath.  Nosebleed.  Nausea and vomiting.  Severe chest pain.  Jerky movements you cannot control (seizures).  How is this diagnosed? This condition is diagnosed by measuring your blood pressure while you are seated, with your arm resting on a surface. The cuff of the blood pressure monitor will be placed directly against the skin of your upper arm at the level of your heart. It should be measured at least twice using the same arm. Certain conditions can cause a difference in blood pressure between your right and left arms. Certain factors can cause blood pressure readings to be lower or higher than normal (elevated) for a short period of time:  When your blood pressure is higher when you are in a  health care provider's office than when you are at home, this is called white coat hypertension. Most people with this condition do not need medicines.  When your blood pressure is higher at home than when you are in a health care provider's office, this is called masked hypertension. Most people with this condition may need medicines to control blood pressure.  If you have a high blood pressure reading during one visit or you have normal blood pressure with other risk factors:  You may be asked to return on a different day to have your blood pressure checked again.  You may be asked to monitor your blood pressure at home for 1 week or longer.  If you are diagnosed with hypertension, you may have other blood or imaging tests to help your health care provider understand your overall risk for other conditions. How is this treated? This condition is treated by making healthy lifestyle  changes, such as eating healthy foods, exercising more, and reducing your alcohol intake. Your health care provider may prescribe medicine if lifestyle changes are not enough to get your blood pressure under control, and if:  Your systolic blood pressure is above 130.  Your diastolic blood pressure is above 80.  Your personal target blood pressure may vary depending on your medical conditions, your age, and other factors. Follow these instructions at home: Eating and drinking  Eat a diet that is high in fiber and potassium, and low in sodium, added sugar, and fat. An example eating plan is called the DASH (Dietary Approaches to Stop Hypertension) diet. To eat this way: ? Eat plenty of fresh fruits and vegetables. Try to fill half of your plate at each meal with fruits and vegetables. ? Eat whole grains, such as whole wheat pasta, brown rice, or whole grain bread. Fill about one quarter of your plate with whole grains. ? Eat or drink low-fat dairy products, such as skim milk or low-fat yogurt. ? Avoid fatty cuts of meat, processed or cured meats, and poultry with skin. Fill about one quarter of your plate with lean proteins, such as fish, chicken without skin, beans, eggs, and tofu. ? Avoid premade and processed foods. These tend to be higher in sodium, added sugar, and fat.  Reduce your daily sodium intake. Most people with hypertension should eat less than 1,500 mg of sodium a day.  Limit alcohol intake to no more than 1 drink a day for nonpregnant women and 2 drinks a day for men. One drink equals 12 oz of beer, 5 oz of wine, or 1 oz of hard liquor. Lifestyle  Work with your health care provider to maintain a healthy body weight or to lose weight. Ask what an ideal weight is for you.  Get at least 30 minutes of exercise that causes your heart to beat faster (aerobic exercise) most days of the week. Activities may include walking, swimming, or biking.  Include exercise to strengthen your  muscles (resistance exercise), such as pilates or lifting weights, as part of your weekly exercise routine. Try to do these types of exercises for 30 minutes at least 3 days a week.  Do not use any products that contain nicotine or tobacco, such as cigarettes and e-cigarettes. If you need help quitting, ask your health care provider.  Monitor your blood pressure at home as told by your health care provider.  Keep all follow-up visits as told by your health care provider. This is important. Medicines  Take over-the-counter and prescription medicines  only as told by your health care provider. Follow directions carefully. Blood pressure medicines must be taken as prescribed.  Do not skip doses of blood pressure medicine. Doing this puts you at risk for problems and can make the medicine less effective.  Ask your health care provider about side effects or reactions to medicines that you should watch for. Contact a health care provider if:  You think you are having a reaction to a medicine you are taking.  You have headaches that keep coming back (recurring).  You feel dizzy.  You have swelling in your ankles.  You have trouble with your vision. Get help right away if:  You develop a severe headache or confusion.  You have unusual weakness or numbness.  You feel faint.  You have severe pain in your chest or abdomen.  You vomit repeatedly.  You have trouble breathing. Summary  Hypertension is when the force of blood pumping through your arteries is too strong. If this condition is not controlled, it may put you at risk for serious complications.  Your personal target blood pressure may vary depending on your medical conditions, your age, and other factors. For most people, a normal blood pressure is less than 120/80.  Hypertension is treated with lifestyle changes, medicines, or a combination of both. Lifestyle changes include weight loss, eating a healthy, low-sodium diet,  exercising more, and limiting alcohol. This information is not intended to replace advice given to you by your health care provider. Make sure you discuss any questions you have with your health care provider. Document Released: 03/07/2005 Document Revised: 02/03/2016 Document Reviewed: 02/03/2016 Elsevier Interactive Patient Education  2018 [employer]. Safe Sex Practicing safe sex means taking steps before and during sex to reduce your risk of:  Getting an STD (sexually transmitted disease).  Giving your partner an STD.  Unwanted pregnancy.  How can I practice safe sex?  To practice safe sex:  Limit your sexual partners to only one partner who is having sex with only you.  Avoid using alcohol and recreational drugs before having sex. These substances can affect your judgment.  Before having sex with a new partner: ? Talk to your partner about past partners, past STDs, and drug use. ? You and your partner should be screened for STDs and discuss the results with each other.  Check your body regularly for sores, blisters, rashes, or unusual discharge. If you notice any of these problems, visit your health care provider.  If you have symptoms of an infection or you are being treated for an STD, avoid sexual contact.  While having sex, use a condom. Make sure to: ? Use a condom every time you have vaginal, oral, or anal sex. Both females and males should wear condoms during oral sex. ? Keep condoms in place from the beginning to the end of sexual activity. ? Use a latex condom, if possible. Latex condoms offer the best protection. ? Use only water-based lubricants or oils to lubricate a condom. Using petroleum-based lubricants or oils will weaken the condom and increase the chance that it will break.  See your health care provider for regular screenings, exams, and tests for STDs.  Talk with your health care provider about the form of birth control (contraception) that is best for  you.  Get vaccinated against hepatitis B and human papillomavirus (HPV).  If you are at risk of being infected with HIV (human immunodeficiency virus), talk with your health care provider about taking a prescription  medicine to prevent HIV infection. You are considered at risk for HIV if: ? You are a man who has sex with other men. ? You are a heterosexual man or woman who is sexually active with more than one partner. ? You take drugs by injection. ? You are sexually active with a partner who has HIV.  This information is not intended to replace advice given to you by your health care provider. Make sure you discuss any questions you have with your health care provider. Document Released: 04/14/2004 Document Revised: 07/22/2015 Document Reviewed: 01/25/2015 Elsevier Interactive Patient Education  [employer].

## 2017-11-24 ENCOUNTER — Telehealth: Payer: Self-pay | Admitting: *Deleted

## 2017-11-24 LAB — COMPREHENSIVE METABOLIC PANEL
ALBUMIN: 4.3 g/dL (ref 3.5–5.5)
ALK PHOS: 79 IU/L (ref 39–117)
ALT: 27 IU/L (ref 0–44)
AST: 19 IU/L (ref 0–40)
Albumin/Globulin Ratio: 1.7 (ref 1.2–2.2)
BILIRUBIN TOTAL: 1 mg/dL (ref 0.0–1.2)
BUN / CREAT RATIO: 10 (ref 9–20)
BUN: 9 mg/dL (ref 6–20)
CHLORIDE: 104 mmol/L (ref 96–106)
CO2: 20 mmol/L (ref 20–29)
Calcium: 8.9 mg/dL (ref 8.7–10.2)
Creatinine, Ser: 0.87 mg/dL (ref 0.76–1.27)
GFR calc non Af Amer: 118 mL/min/{1.73_m2} (ref >59)
GFR, EST AFRICAN AMERICAN: 137 mL/min/{1.73_m2} (ref >59)
GLOBULIN, TOTAL: 2.5 g/dL (ref 1.5–4.5)
Glucose: 88 mg/dL (ref 65–99)
Potassium: 4.4 mmol/L (ref 3.5–5.2)
SODIUM: 140 mmol/L (ref 134–144)
TOTAL PROTEIN: 6.8 g/dL (ref 6.0–8.5)

## 2017-11-24 NOTE — Telephone Encounter (Signed)
-----   Message from Anders Simmonds, [place] sent at 11/24/2017  2:01 PM EDT ----- Please call patient.  His blood sugar, kidney function, liver function, and electrolytes are all normal.  Follow-up as planned.  Thanks, Georgian Co, PA-C

## 2017-11-24 NOTE — Telephone Encounter (Signed)
Patient verified DOB Patient is aware of labs being normal and to follow up as planned. No further questions. 

## 2018-01-23 ENCOUNTER — Ambulatory Visit: Payer: 59 | Admitting: Nurse Practitioner

## 2018-03-14 ENCOUNTER — Other Ambulatory Visit: Payer: Self-pay

## 2018-03-14 ENCOUNTER — Encounter (HOSPITAL_BASED_OUTPATIENT_CLINIC_OR_DEPARTMENT_OTHER): Payer: Self-pay | Admitting: Emergency Medicine

## 2018-03-14 ENCOUNTER — Emergency Department (HOSPITAL_BASED_OUTPATIENT_CLINIC_OR_DEPARTMENT_OTHER)
Admission: EM | Admit: 2018-03-14 | Discharge: 2018-03-15 | Discharge status: Against Medical Advice | Payer: 59 | Attending: Emergency Medicine | Admitting: Emergency Medicine

## 2018-03-14 DIAGNOSIS — Z5321 Procedure and treatment not carried out due to patient leaving prior to being seen by health care provider: Secondary | ICD-10-CM | POA: Insufficient documentation

## 2018-03-14 DIAGNOSIS — R369 Urethral discharge, unspecified: Secondary | ICD-10-CM | POA: Insufficient documentation

## 2018-03-14 NOTE — ED Triage Notes (Signed)
Pt here with "a lot" of penile discharge x 4 days. Denies pain.

## 2018-03-16 ENCOUNTER — Other Ambulatory Visit: Payer: Self-pay

## 2018-03-16 ENCOUNTER — Encounter (HOSPITAL_BASED_OUTPATIENT_CLINIC_OR_DEPARTMENT_OTHER): Payer: Self-pay | Admitting: Emergency Medicine

## 2018-03-16 ENCOUNTER — Emergency Department (HOSPITAL_BASED_OUTPATIENT_CLINIC_OR_DEPARTMENT_OTHER)
Admission: EM | Admit: 2018-03-16 | Discharge: 2018-03-16 | Disposition: A | Discharge status: Home / Self Care | Payer: 59 | Attending: Emergency Medicine | Admitting: Emergency Medicine

## 2018-03-16 DIAGNOSIS — Z87891 Personal history of nicotine dependence: Secondary | ICD-10-CM | POA: Insufficient documentation

## 2018-03-16 DIAGNOSIS — R369 Urethral discharge, unspecified: Secondary | ICD-10-CM | POA: Diagnosis not present

## 2018-03-16 DIAGNOSIS — Z113 Encounter for screening for infections with a predominantly sexual mode of transmission: Secondary | ICD-10-CM | POA: Insufficient documentation

## 2018-03-16 LAB — URINALYSIS, ROUTINE W REFLEX MICROSCOPIC
Bilirubin Urine: NEGATIVE
GLUCOSE, UA: NEGATIVE mg/dL
HGB URINE DIPSTICK: NEGATIVE
Ketones, ur: NEGATIVE mg/dL
Leukocytes, UA: NEGATIVE
Nitrite: NEGATIVE
PH: 5.5 (ref 5.0–8.0)
Protein, ur: NEGATIVE mg/dL
Specific Gravity, Urine: >1.03 — ABNORMAL HIGH (ref 1.005–1.030)

## 2018-03-16 MED ORDER — DOXYCYCLINE HYCLATE 100 MG PO TABS
100.0000 mg | ORAL_TABLET | Freq: Once | ORAL | Status: AC
  Administered 2018-03-16: 100 mg via ORAL
  Filled 2018-03-16: qty 1

## 2018-03-16 MED ORDER — CEFTRIAXONE SODIUM 250 MG IJ SOLR
250.0000 mg | Freq: Once | INTRAMUSCULAR | Status: AC
  Administered 2018-03-16: 250 mg via INTRAMUSCULAR
  Filled 2018-03-16: qty 250

## 2018-03-16 MED ORDER — DOXYCYCLINE HYCLATE 100 MG PO CAPS: 100.0000 mg | ORAL_CAPSULE | Freq: Two times a day (BID) | ORAL | 0 refills | Status: AC

## 2018-03-16 MED ORDER — METRONIDAZOLE 500 MG PO TABS
2000.0000 mg | ORAL_TABLET | Freq: Once | ORAL | Status: AC
  Administered 2018-03-16: 2000 mg via ORAL
  Filled 2018-03-16: qty 4

## 2018-03-16 NOTE — Discharge Instructions (Addendum)
Advised to practice safe sex and have all partners evaluated and treated at the local health department. Also advised to follow with the health Department  or PCP in 1-2 weeks to confirm effectiveness of treatment and receive additional education/evaluation.

## 2018-03-16 NOTE — ED Triage Notes (Signed)
Pt states he would like to be checked for a STD  Pt states he has been having a clear discharge from his penis for the past 5 days or so   Pt was seen last night for same but did not stay

## 2018-03-16 NOTE — ED Notes (Signed)
Patient left at this time with all belongings. 

## 2018-03-16 NOTE — ED Provider Notes (Signed)
[facility] EMERGENCY DEPARTMENT Provider Note  CSN: [id number] Arrival date & time: 03/16/18 0001  Chief Complaint(s) STD check  HPI Chad Austin is a 27 y.o. male with prior history of STDs presents to the emergency department for 5 days of penile discharge.  Patient endorses unprotected sex with 1 sexual partner.  Denies any dysuria.  No scrotal swelling or tenderness.  No fevers or chills.  No nausea or vomiting.  No abdominal pain.  Denies any other physical complaints.  HPI  Past Medical History Past Medical History:  Diagnosis Date  . STD (sexually transmitted disease)   . Syphilis    Patient Active Problem List   Diagnosis Date Noted  . Transient elevated blood pressure 01/23/2017  . History of syphilis 01/23/2017  . Tobacco dependence due to cigarettes 01/23/2017  . High risk homosexual behavior 01/23/2017   Home Medication(s) Prior to Admission medications   Medication Sig Start Date End Date Taking? Authorizing Provider  doxycycline (VIBRAMYCIN) 100 MG capsule Take 1 capsule (100 mg total) by mouth 2 (two) times daily for 10 days. 03/16/18 03/26/18  Nira Connardama,  Eduardo, MD                                                                                                                                    Past Surgical History History reviewed. No pertinent surgical history. Family History Family History  Problem Relation Age of Onset  . Hypertension Other   . Diabetes Other     Social History Social History   Tobacco Use  . Smoking status: Former Smoker    Packs/day: 0.50    Types: Cigarettes  . Smokeless tobacco: Never Used  Substance Use Topics  . Alcohol use: Yes    Comment: occasional  . Drug use: No   Allergies Patient has no known allergies.  Review of Systems Review of Systems as noted in the HPI  Physical Exam Vital Signs  I have reviewed the triage vital signs BP (!) 139/95 (BP Location: Left Arm)   Pulse 88   Temp 98.2  F (36.8 C) (Oral)   Resp 16   Ht 5\' 7"  (1.702 m)   Wt 98 kg   SpO2 98%   BMI 33.83 kg/m   Physical Exam Vitals signs reviewed.  Constitutional:      General: He is not in acute distress.    Appearance: He is well-developed. He is not diaphoretic.  HENT:     Head: Normocephalic and atraumatic.     Jaw: No trismus.     Right Ear: External ear normal.     Left Ear: External ear normal.     Nose: Nose normal.  Eyes:     General: No scleral icterus.    Conjunctiva/sclera: Conjunctivae normal.  Neck:     Musculoskeletal: Normal range of motion.     Trachea: Phonation normal.  Cardiovascular:     Rate and  Rhythm: Normal rate and regular rhythm.  Pulmonary:     Effort: Pulmonary effort is normal. No respiratory distress.     Breath sounds: No stridor.  Abdominal:     General: There is no distension.  Genitourinary:    Penis: Uncircumcised. Discharge present. No swelling.      Scrotum/Testes:        Right: Tenderness or swelling not present.        Left: Tenderness or swelling not present.  Musculoskeletal: Normal range of motion.  Neurological:     Mental Status: He is alert and oriented to person, place, and time.  Psychiatric:        Behavior: Behavior normal.     ED Results and Treatments Labs (all labs ordered are listed, but only abnormal results are displayed) Labs Reviewed  URINALYSIS, ROUTINE W REFLEX MICROSCOPIC - Abnormal; Notable for the following components:      Result Value   Specific Gravity, Urine >1.030 (*)    All other components within normal limits  GC/CHLAMYDIA PROBE AMP ([place]) NOT AT [facility]                                                                                                                         EKG  EKG Interpretation  Date/Time:    Ventricular Rate:    PR Interval:    QRS Duration:   QT Interval:    QTC Calculation:   R Axis:     Text Interpretation:        Radiology No results found. Pertinent labs & imaging  results that were available during my care of the patient were reviewed by me and considered in my medical decision making (see chart for details).  Medications Ordered in ED Medications  cefTRIAXone (ROCEPHIN) injection 250 mg (has no administration in time range)  doxycycline (VIBRA-TABS) tablet 100 mg (has no administration in time range)  metroNIDAZOLE (FLAGYL) tablet 2,000 mg (has no administration in time range)                                                                                                                                    Procedures Procedures  (including critical care time)  Medical Decision Making / ED Course I have reviewed the nursing notes for this encounter and the patient's prior records (if available in EHR or on provided paperwork).    Patient with  penile discharge.  Treated empirically for gonorrhea/chlamydia, and trichomonas.   Advised to practice safe sex and have all partners evaluated and treated at the local health department. Also advised to follow with the health Department in 1-2 weeks to confirm effectiveness of treatment and receive additional education/evaluation. Return precautions given.   The patient is safe for discharge with strict return precautions.   Final Clinical Impression(s) / ED Diagnoses Final diagnoses:  Penile discharge    Disposition: Discharge  Condition: Good  I have discussed the results, Dx and Tx plan with the patient who expressed understanding and agree(s) with the plan. Discharge instructions discussed at great length. The patient was given strict return precautions who verbalized understanding of the instructions. No further questions at time of discharge.    ED Discharge Orders         Ordered    doxycycline (VIBRAMYCIN) 100 MG capsule  2 times daily     03/16/18 0208           Follow Up: Claiborne Rigg, NP [address] [place] [address] [phone]  Schedule an appointment as soon  as possible for a visit  in 2 weeks for recheck     This chart was dictated using voice recognition software.  Despite best efforts to proofread,  errors can occur which can change the documentation meaning.   Nira Conn, MD 03/16/18 [phone]

## 2018-03-19 LAB — GC/CHLAMYDIA PROBE AMP (~~LOC~~) NOT AT ARMC
Chlamydia: NEGATIVE
Neisseria Gonorrhea: NEGATIVE

## 2018-06-19 ENCOUNTER — Telehealth: Payer: Self-pay | Admitting: Nurse Practitioner

## 2018-06-19 NOTE — Telephone Encounter (Signed)
Patient states he needs a note stating he can work due to his BP. Please follow up

## 2018-06-19 NOTE — Telephone Encounter (Signed)
Will route to PCP 

## 2018-06-20 ENCOUNTER — Other Ambulatory Visit: Payer: Self-pay

## 2018-06-20 ENCOUNTER — Ambulatory Visit: Payer: 59 | Attending: Nurse Practitioner | Admitting: Physician Assistant

## 2018-06-20 DIAGNOSIS — I10 Essential (primary) hypertension: Secondary | ICD-10-CM

## 2018-06-20 MED ORDER — AMLODIPINE BESYLATE 5 MG PO TABS: 5.0000 mg | ORAL_TABLET | Freq: Every day | ORAL | 3 refills | Status: AC

## 2018-06-20 MED FILL — ?AMLODIPINE BESYLATE 5MG TA: 5 | 90 days supply | Qty: 90 | Fill #0

## 2018-06-20 NOTE — Progress Notes (Signed)
Patient ID: Chad Austin, male   DOB: [birth year], 28 y.o.   MRN: [id number]     Virtual Visit via Telephone Note  I connected with Chad Austin on 06/20/18 at  2:50 PM EDT by telephone and verified that I am speaking with the correct person using two identifiers.   I discussed the limitations, risks, security and privacy concerns of performing an evaluation and management service by telephone and the availability of in person appointments. I also discussed with the patient that there may be a patient responsible charge related to this service. The patient expressed understanding and agreed to proceed.   History of Present Illness: I am in my office at [facility].  Covid-19 precautions being taken.   Location of patient:  store Patient Consented to phone visit:  yes Participating persons: patient Time on call:  8 mins  Applying for a new job systolic BP in the 150s.  He checks his BP at home and the systolic readings are always in the 150s.  Diastolic in the 90s.  No HA/CP/Dizziness.  In reviewing BP in Epic, BP often high or high normal/borderline.  He is trying to work on diet and exercise too.  There is a lot of htn in his family.    Observations/Objective: TP linear   Assessment and Plan: 1. Hypertension, unspecified type New start on meds.  Check BP OOO and record daily(he has a monitor at home.)  Bring BP readings to next visit.  CMP WNL 11/2017 - amLODipine (NORVASC) 5 MG tablet; Take 1 tablet (5 mg total) by mouth daily.  Dispense: 90 tablet; Refill: 3    Follow Up Instructions: New start on meds.  Check BP OOO and record daily(he has a monitor at home.)  Bring BP readings to next visit. BP check with Georgiana Shore Ausdall in 3 weeks.     I discussed the assessment and treatment plan with the patient. The patient was provided an opportunity to ask questions and all were answered. The patient agreed with the plan and demonstrated an understanding of the instructions.   The patient was  advised to call back or seek an in-person evaluation if the symptoms worsen or if the condition fails to improve as anticipated.  I provided 8 minutes of non-face-to-face time during this encounter.   Georgian Co, PA-C

## 2018-06-20 NOTE — Telephone Encounter (Signed)
Patient will need to be evaluated for his BP. I have not seen this patient since last year.

## 2018-06-22 NOTE — Telephone Encounter (Signed)
Pt. Already got a note for his BP from another provider and have a BP check appt. With Clinical Pharmacist on 07/11/2018.

## 2018-07-11 ENCOUNTER — Encounter: Payer: 59 | Admitting: Pharmacist

## 2018-07-12 ENCOUNTER — Ambulatory Visit: Payer: 59 | Attending: Nurse Practitioner | Admitting: Pharmacist

## 2018-07-12 ENCOUNTER — Encounter: Payer: Self-pay | Admitting: Pharmacist

## 2018-07-12 ENCOUNTER — Other Ambulatory Visit: Payer: Self-pay

## 2018-07-12 VITALS — BP 123/83 | HR 77

## 2018-07-12 DIAGNOSIS — I10 Essential (primary) hypertension: Secondary | ICD-10-CM

## 2018-07-12 NOTE — Patient Instructions (Signed)

## 2018-07-12 NOTE — Progress Notes (Signed)
   S: PCP: Zelda      Patient arrives in good spirits. Presents to the clinic for hypertension evaluation, counseling, and management. Patient was referred by Marylene Land on 06/20/18.  Reported home Bps 150s/90s. Marylene Land started amlodipine 5 mg daily.   Denies chest pain, dyspnea, HA or blurred vision. Denies BLE edema.    Patient reports adherence with amlodipine. Did not take this morning.   Current BP Medications include:   - amlodipine 5 mg daily  Antihypertensives tried in the past include: NKDA, no anti-hypertensives tried in the past  Dietary habits include: limits salt but does endorse daily intake of sodas Exercise habits include: exercises regularly (plays basketball) Family / Social history:  - Fhx: reports strong family history of HTN  - Current every day smoker (amount varies) - Occasionally drinks alcohol   Home BP readings:  - believes home cuff is inaccurate; reports elevated home pressure  O:  L arm after 5 minutes rest: 123/83, HR 77  Last 3 Office BP readings: BP Readings from Last 3 Encounters:  07/12/18 123/83  03/16/18 (!) 142/95  03/14/18 (!) 139/99   BMET    Component Value Date/Time   NA 140 11/23/2017 1044   K 4.4 11/23/2017 1044   CL 104 11/23/2017 1044   CO2 20 11/23/2017 1044   GLUCOSE 88 11/23/2017 1044   GLUCOSE 117 (H) 04/25/2013 1856   BUN 9 11/23/2017 1044   CREATININE 0.87 11/23/2017 1044   CALCIUM 8.9 11/23/2017 1044   GFRNONAA 118 11/23/2017 1044   GFRAA 137 11/23/2017 1044   Renal function: CrCl cannot be calculated (Patient's most recent lab result is older than the maximum 21 days allowed.).  Clinical ASCVD: No  The ASCVD Risk score Denman George DC Jr., et al., 2013) failed to calculate for the following reasons:   The 2013 ASCVD risk score is only valid for ages 104 to 69  A/P: Hypertension relatively newly diagnosed (06/20/18) currently controlled on current medications. BP Goal <130/80 mmHg. Patient is adherent with current  medications.  -Continued amlodipine 5 mg  -Lipid; future -Counseled on lifestyle modifications for blood pressure control including reduced dietary sodium, increased exercise, adequate sleep -ASCVD: pt < 40 YO. No DM or clinical ASCVD. Needs lipid panel for wellness. Will defer to PCP.  -HM: UTD on appropriate vaccines  - Encouraged pt to f/u with PCP.   Results reviewed and written information provided. Total time in face-to-face counseling 15 minutes.   F/U with PCP.    Butch Penny, PharmD, CPP Clinical Pharmacist [facility] [phone]

## 2018-10-08 MED FILL — ?AMLODIPINE BESYLATE 5MG TA: 5 | 90 days supply | Qty: 90 | Fill #1

## 2023-12-30 ENCOUNTER — Encounter (HOSPITAL_COMMUNITY): Payer: Self-pay | Admitting: Radiology

## 2023-12-30 ENCOUNTER — Other Ambulatory Visit: Payer: Self-pay

## 2023-12-30 ENCOUNTER — Emergency Department (HOSPITAL_COMMUNITY)
Admission: EM | Admit: 2023-12-30 | Discharge: 2023-12-31 | Disposition: A | Discharge status: Home / Self Care | Attending: Emergency Medicine | Admitting: Emergency Medicine

## 2023-12-30 DIAGNOSIS — T50914A Poisoning by multiple unspecified drugs, medicaments and biological substances, undetermined, initial encounter: Secondary | ICD-10-CM | POA: Insufficient documentation

## 2023-12-30 DIAGNOSIS — T6594XA Toxic effect of unspecified substance, undetermined, initial encounter: Secondary | ICD-10-CM

## 2023-12-30 DIAGNOSIS — R400 Somnolence: Secondary | ICD-10-CM | POA: Diagnosis present

## 2023-12-30 LAB — CBC
HCT: 41.6 % (ref 39.0–52.0)
Hemoglobin: 13.7 g/dL (ref 13.0–17.0)
MCH: 31.1 pg (ref 26.0–34.0)
MCHC: 32.9 g/dL (ref 30.0–36.0)
MCV: 94.3 fL (ref 80.0–100.0)
Platelets: 252 K/uL (ref 150–400)
RBC: 4.41 MIL/uL (ref 4.22–5.81)
RDW: 13.2 % (ref 11.5–15.5)
WBC: 4.3 K/uL (ref 4.0–10.5)
nRBC: 0 % (ref 0.0–0.2)

## 2023-12-30 LAB — COMPREHENSIVE METABOLIC PANEL WITH GFR
ALT: 24 U/L (ref 0–44)
AST: 20 U/L (ref 15–41)
Albumin: 4.3 g/dL (ref 3.5–5.0)
Alkaline Phosphatase: 70 U/L (ref 38–126)
Anion gap: 13 (ref 5–15)
BUN: 8 mg/dL (ref 6–20)
CO2: 23 mmol/L (ref 22–32)
Calcium: 9.2 mg/dL (ref 8.9–10.3)
Chloride: 104 mmol/L (ref 98–111)
Creatinine, Ser: 0.8 mg/dL (ref 0.61–1.24)
GFR, Estimated: >60 mL/min (ref >60)
Glucose, Bld: 94 mg/dL (ref 70–99)
Potassium: 3.8 mmol/L (ref 3.5–5.1)
Sodium: 139 mmol/L (ref 135–145)
Total Bilirubin: 1 mg/dL (ref 0.0–1.2)
Total Protein: 6.8 g/dL (ref 6.5–8.1)

## 2023-12-30 LAB — ETHANOL: Alcohol, Ethyl (B): 21 mg/dL — ABNORMAL HIGH (ref <15)

## 2023-12-30 NOTE — ED Triage Notes (Addendum)
 Pt was picked up from [facility] after bystanders found him nodding off unable to stay away. He endorsed taking Xanax, marijuana, and alcohol with no intention of harming himself. He was just partying.  118/60 BS 111 18 NS  18G LAC

## 2023-12-30 NOTE — ED Provider Notes (Signed)
 [place] EMERGENCY DEPARTMENT AT [facility] Provider Note   CSN: [id number] Arrival date & time: 12/30/23  1943     Patient presents with: Ingestion   Chad Austin is a 33 y.o. male with history of syphilis, tobacco dependence, elevated blood pressure.  Presents to ED for evaluation of drug ingestion.  Per triage note, patient picked up from the street after being found nodding off by bystanders unable to stay awake.  Patient endorsing taking Xanax, marijuana, alcohol.  Denies intention of self-harm.  On my exam, patient very somnolent.  Denies drug use tonight.  Unable to participate in conversation.  No apparent distress, breath sounds are equal and unlabored, follows commands appropriately.  Will need to metabolize.  Placed on monitor at this time.   Ingestion       Prior to Admission medications   Medication Sig Start Date End Date Taking? Authorizing Provider  amLODipine  (NORVASC ) 5 MG tablet Take 1 tablet (5 mg total) by mouth daily. 06/20/18   McClung, Angela M, PA-C    Allergies: Patient has no known allergies.    Review of Systems  Unable to perform ROS: Acuity of condition (Level 5 caveat)  All other systems reviewed and are negative.   Updated Vital Signs BP 112/70 (BP Location: Right Arm)   Pulse 79   Temp 98 F (36.7 C) (Oral)   Resp 16   Ht 5' 7 (1.702 m)   Wt 98 kg   SpO2 99%   BMI 33.83 kg/m   Physical Exam Vitals and nursing note reviewed.  Constitutional:      General: He is not in acute distress.    Appearance: He is well-developed.  HENT:     Head: Normocephalic and atraumatic.  Eyes:     Conjunctiva/sclera: Conjunctivae normal.  Cardiovascular:     Rate and Rhythm: Normal rate and regular rhythm.     Heart sounds: No murmur heard. Pulmonary:     Effort: Pulmonary effort is normal. No respiratory distress.     Breath sounds: Normal breath sounds.  Abdominal:     Palpations: Abdomen is soft.     Tenderness: There is no  abdominal tenderness.  Musculoskeletal:        General: No swelling.     Cervical back: Neck supple.  Skin:    General: Skin is warm and dry.     Capillary Refill: Capillary refill takes less than 2 seconds.  Neurological:     Mental Status: He is alert and oriented to person, place, and time. Mental status is at baseline.     Comments: Follows commands appropriately.  Denies SI, HI, AVH.  Psychiatric:        Mood and Affect: Mood normal.     (all labs ordered are listed, but only abnormal results are displayed) Labs Reviewed  ETHANOL - Abnormal; Notable for the following components:      Result Value   Alcohol, Ethyl (B) 21 (*)    All other components within normal limits  CBC  COMPREHENSIVE METABOLIC PANEL WITH GFR  URINE DRUG SCREEN    EKG: None  Radiology: No results found.  Procedures   Medications Ordered in the ED - No data to display  Medical Decision Making Amount and/or Complexity of Data Reviewed Labs: ordered.   33 year old male presents for evaluation.  Please see HPI.  On examination the patient is afebrile and nontachycardic.  Lung sounds clear bilaterally, no hypoxia.  Abdomen soft and compressible.  Neuroexam  at baseline.  Clinically intoxicated.  Patient workup including CBC, CMP, ethanol, UDS.  CBC without leukocytosis or anemia.  CMP grossly unremarkable.  Ethanol 21.  UDS was unable to be collected.  Patient was observed in the ER for 8 hours.  After 8-hour observation period, patient was seen to be ambulating throughout the department.  Patient requesting discharge home.  Patient reports that he will call a friend to come pick him up.  He ambulates with steady gait, alert and oriented x 4.  Able to make medical decisions for himself.  Patient discharged home at this time.    Final diagnoses:  Ingestion of substance, undetermined intent, initial encounter    ED Discharge Orders     None          Ruthell Lonni JULIANNA DEVONNA 12/31/23  0310    Griselda Norris, MD 12/31/23 [phone]

## 2023-12-30 NOTE — ED Triage Notes (Signed)
 40mg  xanax, a lot of alcohol, and marijuana.

## 2023-12-30 NOTE — ED Provider Notes (Incomplete)
  [place] EMERGENCY DEPARTMENT AT [facility] Provider Note   CSN: [id number] Arrival date & time: 12/30/23  1943     Patient presents with: Ingestion   Chad Austin is a 33 y.o. male with history of syphilis, tobacco dependence, elevated blood pressure.  Presents to ED for evaluation of drug ingestion.  Per triage note, patient picked up from the street after being found nodding off by bystanders unable to stay awake.  Patient endorsing taking Xanax, marijuana, alcohol.  Denies intention of self-harm.  On my exam, patient very somnolent.  Denies drug use tonight.  Unable to participate in conversation.  No apparent distress, breath sounds are equal and unlabored, follows commands appropriately.  Will need to metabolize.  Placed on monitor at this time.   Ingestion       Prior to Admission medications   Medication Sig Start Date End Date Taking? Authorizing Provider  amLODipine  (NORVASC ) 5 MG tablet Take 1 tablet (5 mg total) by mouth daily. 06/20/18   McClung, Angela M, PA-C    Allergies: Patient has no known allergies.    Review of Systems  Updated Vital Signs BP 112/70 (BP Location: Right Arm)   Pulse 79   Temp 97.6 F (36.4 C) (Oral)   Resp 16   Ht 5' 7 (1.702 m)   Wt 98 kg   SpO2 99%   BMI 33.83 kg/m   Physical Exam  (all labs ordered are listed, but only abnormal results are displayed) Labs Reviewed  ETHANOL - Abnormal; Notable for the following components:      Result Value   Alcohol, Ethyl (B) 21 (*)    All other components within normal limits  CBC  COMPREHENSIVE METABOLIC PANEL WITH GFR  URINE DRUG SCREEN    EKG: None  Radiology: No results found.  {Document cardiac monitor, telemetry assessment procedure when appropriate:32947} Procedures   Medications Ordered in the ED - No data to display    {Click here for ABCD2, HEART and other calculators REFRESH Note before signing:1}                              Medical Decision  Making Amount and/or Complexity of Data Reviewed Labs: ordered.   ***  {Document critical care time when appropriate  Document review of labs and clinical decision tools ie CHADS2VASC2, etc  Document your independent review of radiology images and any outside records  Document your discussion with family members, caretakers and with consultants  Document social determinants of health affecting pt's care  Document your decision making why or why not admission, treatments were needed:32947:::1}   Final diagnoses:  None    ED Discharge Orders     None

## 2023-12-31 NOTE — ED Notes (Signed)
 Pt refused vitals

## 2023-12-31 NOTE — Discharge Instructions (Signed)
 Follow up with PCP

## 2023-12-31 NOTE — ED Notes (Signed)
 Patient noted taking his IV out stating he was ready to go home. Patient alert and oriented x4, walking around his bed gait steady.
